# Patient Record
Sex: Male | Born: 1940 | Race: Black or African American | Hispanic: No | Marital: Single | Smoking: Current every day smoker
Health system: Southern US, Community
[De-identification: ages and names within clinical notes are randomized; demographics above are authoritative.]

## PROBLEM LIST (undated history)

## (undated) DIAGNOSIS — I10 Essential (primary) hypertension: Secondary | ICD-10-CM

## (undated) DIAGNOSIS — N184 Chronic kidney disease, stage 4 (severe): Secondary | ICD-10-CM

## (undated) DIAGNOSIS — I5042 Chronic combined systolic (congestive) and diastolic (congestive) heart failure: Secondary | ICD-10-CM

## (undated) DIAGNOSIS — E785 Hyperlipidemia, unspecified: Secondary | ICD-10-CM

## (undated) DIAGNOSIS — I420 Dilated cardiomyopathy: Secondary | ICD-10-CM

## (undated) HISTORY — DX: Hyperlipidemia, unspecified: E78.5

## (undated) HISTORY — DX: Chronic kidney disease, stage 4 (severe): N18.4

## (undated) HISTORY — DX: Essential (primary) hypertension: I10

## (undated) HISTORY — DX: Chronic combined systolic (congestive) and diastolic (congestive) heart failure: I50.42

## (undated) HISTORY — DX: Dilated cardiomyopathy: I42.0

---

## 1998-06-27 ENCOUNTER — Encounter: Payer: Self-pay | Admitting: Internal Medicine

## 2006-06-12 HISTORY — PX: CARDIAC CATHETERIZATION: SHX172

## 2006-06-12 HISTORY — PX: TRANSTHORACIC ECHOCARDIOGRAM: SHX275

## 2006-06-20 ENCOUNTER — Inpatient Hospital Stay (HOSPITAL_COMMUNITY): Admission: EM | Admit: 2006-06-20 | Discharge: 2006-06-27 | Payer: Self-pay | Admitting: Emergency Medicine

## 2006-08-27 ENCOUNTER — Inpatient Hospital Stay: Payer: Self-pay | Admitting: Internal Medicine

## 2006-08-27 ENCOUNTER — Other Ambulatory Visit: Payer: Self-pay

## 2006-08-28 ENCOUNTER — Encounter: Payer: Self-pay | Admitting: Internal Medicine

## 2006-08-31 ENCOUNTER — Other Ambulatory Visit: Payer: Self-pay

## 2006-10-21 ENCOUNTER — Other Ambulatory Visit: Payer: Self-pay

## 2006-10-22 ENCOUNTER — Inpatient Hospital Stay: Payer: Self-pay | Admitting: *Deleted

## 2006-10-23 ENCOUNTER — Other Ambulatory Visit: Payer: Self-pay

## 2006-11-18 ENCOUNTER — Inpatient Hospital Stay: Payer: Self-pay | Admitting: Internal Medicine

## 2006-11-18 ENCOUNTER — Other Ambulatory Visit: Payer: Self-pay

## 2006-12-14 ENCOUNTER — Inpatient Hospital Stay: Payer: Self-pay | Admitting: Internal Medicine

## 2008-05-11 ENCOUNTER — Encounter: Payer: Self-pay | Admitting: Internal Medicine

## 2008-11-17 ENCOUNTER — Encounter: Payer: Self-pay | Admitting: Internal Medicine

## 2008-12-11 ENCOUNTER — Encounter: Payer: Self-pay | Admitting: Internal Medicine

## 2009-06-11 ENCOUNTER — Ambulatory Visit: Payer: Self-pay | Admitting: Cardiovascular Disease

## 2009-06-11 DIAGNOSIS — I509 Heart failure, unspecified: Secondary | ICD-10-CM | POA: Insufficient documentation

## 2009-06-11 DIAGNOSIS — R011 Cardiac murmur, unspecified: Secondary | ICD-10-CM

## 2009-06-11 LAB — CONVERTED CEMR LAB: Pro B Natriuretic peptide (BNP): 754 pg/mL — ABNORMAL HIGH (ref 0.0–100.0)

## 2009-06-15 LAB — CONVERTED CEMR LAB
BUN: 60 mg/dL — ABNORMAL HIGH (ref 6–23)
Chloride: 100 meq/L (ref 96–112)
Glucose, Bld: 86 mg/dL (ref 70–99)
Potassium: 4.3 meq/L (ref 3.5–5.3)
Sodium: 136 meq/L (ref 135–145)

## 2009-09-06 ENCOUNTER — Inpatient Hospital Stay: Payer: Self-pay | Admitting: Internal Medicine

## 2010-04-12 NOTE — Assessment & Plan Note (Signed)
Summary: NP6/AMD   Visit Type:  New Patient Referring Provider:  Mariah Milling Primary Provider:  Daisy Lazar, MD  CC:  some short of breath.  History of Present Illness: Mr. Isaac Collins is a very pleasant 70 year old gentleman, known to me from [facility], with a history of nonischemic cardiomyopathy, history of severe congestive heart failure who was placed on hospice in 2008 who is been doing very well since that time on aggressive medical management and diuresis who presents to reestablish care.  He states that he has had some recent abdominal swelling. His sister increased his diuretic to twice a day. She states that he has not been taking it more than once a day with metolazone in the morning and this could have caused the fluid accumulation. With Lasix b.i.d., his abdominal swelling has significantly improved and is almost at baseline.  His last echocardiogram showed an ejection fraction 25%, dilated left ventricle, moderate mitral regurg, moderate tricuspid regurg, mildly elevated right ventricular systolic pressure estimated at 36 mmHg, severe right ventricular systolic dysfunction. Date of this echocardiogram is uncertain. This was done by Dr. Bethann Punches of the internal medicine Department at [facility].     Preventive Screening-Counseling & Management  Alcohol-Tobacco     Alcohol drinks/day: <1     Smoking Status: current     Packs/Day: 1-2 cigs a day  Caffeine-Diet-Exercise     Caffeine use/day: 1 cup     Does Patient Exercise: some  Current Problems (verified): 1)  Cardiac Murmur  (ICD-785.2) 2)  CHF  (ICD-428.0)  Current Medications (verified): 1)  Isosorbide Mononitrate Cr 30 Mg Xr24h-Tab (Isosorbide Mononitrate) .... Take One Tablet By Mouth Daily 2)  Spironolactone 25 Mg Tabs (Spironolactone) .... Take One Tablet By Mouth Daily 3)  Metolazone 5 Mg Tabs (Metolazone) .... Take One Tablet By Mouth Two Times A Day 4)  Ibuprofen 400 Mg Tabs (Ibuprofen) ....  As Needed 5)  Allopurinol 100 Mg Tabs (Allopurinol) .Marland Kitchen.. 1 By Mouth Once Daily 6)  Hydralazine Hcl 10 Mg Tabs (Hydralazine Hcl) .... 1/2 By Mouth Two Times A Day 7)  Hydroxyzine Hcl 25 Mg Tabs (Hydroxyzine Hcl) .Marland Kitchen.. 1 By Mouth Four Times A Day As Needed For Itching. 8)  Digoxin 0.125 Mg Tabs (Digoxin) .... Take One Tablet By Mouth Daily 9)  Furosemide 80 Mg Tabs (Furosemide) .... Take One Tablet By Mouth Every 8 Hours 10)  Tylenol Arthritis Pain 650 Mg Cr-Tabs (Acetaminophen) .... As Needed 11)  Carvedilol 6.25 Mg Tabs (Carvedilol) .... Take One Tablet By Mouth Twice A Day 12)  Enalapril Maleate 5 Mg Tabs (Enalapril Maleate) .... Take One Tablet By Mouth Twice A Day  Allergies (verified): No Known Drug Allergies  Past History:  Past Medical History: Last updated: 01/18/2009 Nonischemic cardiomyopathy Severe congestive heart failure Hypertension Hyperlipidemia  Risk Factors: Alcohol Use: <1 (06/11/2009) Caffeine Use: 1 cup (06/11/2009) Exercise: some (06/11/2009)  Risk Factors: Smoking Status: current (06/11/2009) Packs/Day: 1-2 cigs a day (06/11/2009)  Social History: Alcohol drinks/day:  <1 Smoking Status:  current Packs/Day:  1-2 cigs a day Caffeine use/day:  1 cup Does Patient Exercise:  some  Review of Systems  The patient denies fever, weight loss, weight gain, vision loss, decreased hearing, hoarseness, chest pain, syncope, dyspnea on exertion, peripheral edema, prolonged cough, abdominal pain, incontinence, muscle weakness, depression, and enlarged lymph nodes.         ABD swelling, improving  Vital Signs:  Patient profile:   70 year old male Height:  69 inches Weight:      133.50 pounds BMI:     19.79 Pulse rate:   111 / minute Pulse rhythm:   regular BP sitting:   100 / 80  (left arm) Cuff size:   regular  Vitals Entered By: Mercer Pod (June 11, 2009 11:48 AM)  Physical Exam  General:  well-appearing, thin male in no apparent distress,  alert and oriented x3, HEENT exam is benign, oropharynx is clear, neck is supple with no JVP or carotid bruits, heart sounds are tachycardic with S1-S2 and no murmurs appreciated, lungs are clear to auscultation with no wheezes or rales, abdominal exam is benign, no significant lower extremity edema, neurologic exam is grossly nonfocal, skin is warm and dry.   New Orders:     1)  T-Basic Metabolic Panel [id number])     2)  T-BNP  (B Natriuretic Peptide) ([id number])     3)  T-Digoxin ([id number])   EKG  Procedure date:  06/11/2009  Findings:      sinus tachycardia with poor R-wave progression through the precordial leads, LVH, left anterior fascicular block, T-wave abnormality in leads V6, borderline T waves in lead one and aVL.  Impression & Recommendations:  Problem # 1:  CHF (ICD-428.0) history of nonischemic cardiomyopathy, systolic heart failure. He has been out of the hospital for over 2 years on aggressive medical management.  His last echocardiogram suggested an ejection fraction of less than 25%. He has not had any runs of palpitations or tachyarrhythmias concerning for VT though he certainly would be at risk of ventricular arrhythmia. We will talk with him and see whether he would like to meet with electrophysiology. We did make his request previously and he canceled his visit.  His heart rate is elevated today. We will have him complete some routine blood work to make her that he is not overly diuresed. We will also increase his Coreg to 12.5 mg b.i.d.. We have continued his other medications at their current doses. There may be some medication noncompliance though in general he appears to be taking most of his medications as directed. We'll call his sister at phone number [phone] with any lab abnormalities or any medications dosage changes.  His updated medication list for this problem includes:    Isosorbide Mononitrate Cr 30 Mg Xr24h-tab (Isosorbide mononitrate) .Marland Kitchen...  Take one tablet by mouth daily    Spironolactone 25 Mg Tabs (Spironolactone) .Marland Kitchen... Take one tablet by mouth daily    Metolazone 5 Mg Tabs (Metolazone) .Marland Kitchen... Take one tablet by mouth two times a day    Digoxin 0.125 Mg Tabs (Digoxin) .Marland Kitchen... Take one tablet by mouth daily    Furosemide 80 Mg Tabs (Furosemide) .Marland Kitchen... Take one tablet by mouth every 8 hours    Carvedilol 12.5 Mg Tabs (Carvedilol) .Marland Kitchen... Take one tablet by mouth twice a day    Enalapril Maleate 5 Mg Tabs (Enalapril maleate) .Marland Kitchen... Take one tablet by mouth twice a day  Orders: T-Basic Metabolic Panel [phone]) T-BNP  (B Natriuretic Peptide) [id number]) T-Digoxin [id number])  Patient Instructions: 1)  Your physician recommends that you schedule a follow-up appointment in: 6 months 2)  Your physician has recommended you make the following change in your medication: increase coreg 12.5mg  two times a day Prescriptions: CARVEDILOL 12.5 MG TABS (CARVEDILOL) Take one tablet by mouth twice a day  #60 x 6   Entered by:   Charlena Cross, RN, BSN   Authorized by:   Dossie Arbour MD  Signed by:   Charlena Cross, RN, BSN on 06/11/2009   Method used:   Electronically to        [facility] [employer] [phone]* (retail)       [address] [place], [place]  [address]       Ph: [phone] or [phone]       Fax: [phone]   RxID:   [id number]

## 2010-04-12 NOTE — Progress Notes (Signed)
Summary: PHI  PHI   Imported By: Harlon Flor 06/14/2009 13:36:14  _____________________________________________________________________  External Attachment:    Type:   Image     Comment:   External Document

## 2010-07-29 NOTE — Discharge Summary (Signed)
[name], Isaac Collins               ACCOUNT NO.:  [id number]   MEDICAL RECORD NO.:  [id number]          PATIENT TYPE:  INP   LOCATION:  2920                         FACILITY:  [facility]   PHYSICIAN:  Nanetta Batty, M.D.   DATE OF BIRTH:  08/14/1940   DATE OF ADMISSION:  06/20/2006  DATE OF DISCHARGE:  06/27/2006                               DISCHARGE SUMMARY   ADDENDUM:  From job #[id number].   DISCHARGE MEDICATIONS:  1. Aspirin 81 mg daily.  2. Coreg 3.125 mg twice a day.  3. Digoxin 0.125 mg a day.  4. K-Dur 20 mEq a day.  5. Lasix 40 mg a day.  6. Bidil 37.5/20 a half three times per day.   He should stop his other medications he was previously on.  He should  reduce his sodium to less than 2000 mg per day.  He should reduce his  fluid intake to 1500 mL or less.   He has a followup appointment with Dr. Allyson Sabal on April 29th at 10:15 in  the [place] office.      [name], N.P.      Nanetta Batty, M.D.  Electronically Signed    BB/MEDQ  D:  06/27/2006  T:  06/27/2006  Job:  [id number]   cc:   Dennison Mascot

## 2010-07-29 NOTE — Discharge Summary (Signed)
[name], LEASE NO.:  [id number]   MEDICAL RECORD NO.:  [id number]          PATIENT TYPE:  INP   LOCATION:  2920                         FACILITY:  [facility]   PHYSICIAN:  Nanetta Batty, M.D.   DATE OF BIRTH:  [birth year]   DATE OF ADMISSION:  06/20/2006  DATE OF DISCHARGE:  06/27/2006                               DISCHARGE SUMMARY   HISTORY AND HOSPITAL COURSE:  The patient is a 70 year old African  American male patient of Dr. Allyson Sabal who is seen in [place].  He  apparently was initially seen in March.  He was self-referred for  shortness of breath and swelling in his legs.  An echocardiogram  revealed an ejection fraction of 10% with severe TR.  His medications  were adjusted, and he came in later in March for followup.  He was class  IV congestive heart failure.  Dr. Allyson Sabal talked to him about admission to  the hospital.  He had some personal business to attend, and he agreed  for admission to the hospital the following day.  Thus, he came to the  hospital on June 20, 2006, for diuresis.  He also has renal  insufficiency.  He was put on intravenous dobutamine and given  intravenous Lasix.  Over the next several days, he diuresed.  He also  had nonsustained ventricular tachycardia.  His dobutamine was titrated  down starting on June 23, 2006 to June 24, 2006.  By June 25, 2006,  he was ready for a right and left heart catheter performed by Dr. Allyson Sabal.  His coronary arteries were normal.  An left ventriculogram was not done  to be conservative with the amount of dye he received.  His RA pressure  was 10/10, his RV 43/9, PA 39/24.  Mean was 31, wedge was 19.  His  cardiac output was 3.6 and 2.6, and his indexes were 2 and 1.4.  The  following day, he was seen by Dr. Alanda Amass.  Dr. Alanda Amass thought he  may eventually need an ICB, but medical trial will need to be done  first.  He did have a mildly elevated PSA of 5.29 on admission.  Thus,  he thought he  would need a followup PSA as an outpatient and possible  referral to a urologist.  He was discharged on June 27, 2006, seen by  Dr. Yates Decamp.  His blood pressure at that time was 103/52.  His  temperature was 97.7.   LABS:  On June 26, 2006, his sodium was 133, potassium was 4.3, BUN was  46, creatinine was 1.85.  On June 27, 2006, his BUN was 38, creatinine  was 1.53, his BNP was 3505.  His hemoglobin was 14, hematocrit was 43,  WBC was 5.7, and platelets was 152.  AST was 38, ALT was 39.  Repeat AST  was 23 and ALT was 23.  CK-MB and troponin were negative.  Initial CK-MB  was 2216.  It came down to 1743 and 1610 and then back to 3505.  PSA was  5.29.  TSH was 1.881.   Chest x-ray on  June 24, 2006 showed no significant change in  cardiomegaly and small right pleural effusion.  EKG showed normal sinus  rhythm left anterior descending, Q waves in V2 and V3, and also inferior  leads.  He did have several runs of an supraventricular tachycardia  during his hospitalization.   DISCHARGE DIAGNOSES:  1. Acute systolic congestive heart failure, Class IV.  2. Nonischemic cardiomyopathy.  3. Status post cardiac catheter with normal coronary arteries.  4. Chronic renal insufficiency.  5. Hypotension, difficult to titrate medications.  6. Nonsustained ventricular tachycardia.  7. Elevated uric acid.      Isaac Collins, N.P.      Nanetta Batty, M.D.  Electronically Signed    BB/MEDQ  D:  06/27/2006  T:  06/27/2006  Job:  [id number]   cc:   Dennison Mascot

## 2010-07-29 NOTE — Cardiovascular Report (Signed)
NAMEDESMOND, SZABO NO.:  [id number]   MEDICAL RECORD NO.:  [id number]          PATIENT TYPE:  INP   LOCATION:  2920                         FACILITY:  [facility]   PHYSICIAN:  Nanetta Batty, M.D.   DATE OF BIRTH:  [birth year]   DATE OF PROCEDURE:  DATE OF DISCHARGE:                            CARDIAC CATHETERIZATION   HISTORY OF PRESENT ILLNESS:  Mr. Isaac Collins is  70 year old African American  male whom I saw March 26th with congestive heart failure and volume  overload.  Echo revealed an EF of 10%.  He also has a history of alcohol  use.  He was admitted to the hospital on April 9th for IV dobutamine and  diuresis.  He diuresed at least ten liters.  He presents now for right  and left heart catheter to define his anatomy and physiology.   PROCEDURAL DESCRIPTION:  The patient was brought to the second floor to  the [facility] cardiac cath lab in the postabsorptive state.  Premedicated on p.o. valium.  His right groin was prepped and shaved in  the usual sterile fashion, 1% Xylocaine was used for local anesthesia.  A 6-French sheath was inserted into the right femoral artery using  standard Seldinger technique.  A 7-French sheath was inserted into the  right femoral vein.  A 7-French ballooned-tip femoral  __________ Theone Murdoch catheter was then advanced to the right heart chambers obtaining  sequential pressures.  Fick and __________ cardiac outputs were also  obtained.  A 6-French right and left diagnostic catheters were used for  __________ coronary angiography.  A left ventriculography was not  performed because of the patient's creatinine due to dye considerations.  LVEDP was measured using a pigtail catheter.   HEMODYNAMICS:  1. Aortic systolic pressure 101, diastolic pressure 74, left      ventricular systolic pressure 104, end-diastolic pressure 21.  2. RA pressure:  A-wave 10, V-wave 10, mean 8.  3. RV pressure:  Systolic  __________, diastolic pressure  9.  4. PA pressure:  Systolic 39, diastolic pressure 24, mean 31.  5. Coronary capillary wedge pressure:  A-wave 21, V-wave 20, mean 19.  6. Cardiac output by Fick was 3.6 liters/minute with an index of 2      liters/minute/m2, right thermodilution 2.6 liters/minute with an      index of 1.4 liters/minute/m2.   SEPTAL CORONARY ANGIOGRAPHY:  1. Left main normal.  2. LAD normal.  3. Left circumflex normal.  4. Right coronary artery was dominant and normal.   IMPRESSION:  Mr. Isaac Collins has normal coronary arteries with cardiac index  of 1.4 l/m2 and fairly normal filling pressures after several days of  dobutamine and diuresis.  He will be treated as a nonischemic  cardiomyopathy.   The sheath was removed and pressure was held on the groin to achieve  hemostasis.  Patient left the operating room in stable condition.      Nanetta Batty, M.D.  Electronically Signed     JB/MEDQ  D:  06/25/2006  T:  06/25/2006  Job:  [id number]   cc:   Patrcia Dolly  [facility]  [facility]  [facility]  [place]

## 2010-08-09 ENCOUNTER — Other Ambulatory Visit: Payer: Self-pay | Admitting: Emergency Medicine

## 2010-08-09 MED ORDER — ISOSORBIDE MONONITRATE ER 30 MG PO TB24: 30.0000 mg | ORAL_TABLET | Freq: Every day | ORAL | Status: DC

## 2010-09-23 ENCOUNTER — Other Ambulatory Visit: Payer: Self-pay | Admitting: Cardiovascular Disease

## 2010-10-05 ENCOUNTER — Encounter: Payer: Self-pay | Admitting: Cardiovascular Disease

## 2010-11-22 ENCOUNTER — Telehealth: Payer: Self-pay

## 2010-11-22 MED ORDER — FUROSEMIDE 80 MG PO TABS: 80.0000 mg | ORAL_TABLET | Freq: Every day | ORAL | Status: DC | PRN

## 2010-11-22 NOTE — Telephone Encounter (Signed)
Refill requested for furosemide 80 mg take one tablet daily as needed.

## 2011-01-12 ENCOUNTER — Other Ambulatory Visit: Payer: Self-pay

## 2011-01-12 MED ORDER — HYDRALAZINE HCL 10 MG PO TABS: 5.0000 mg | ORAL_TABLET | Freq: Two times a day (BID) | ORAL | Status: DC

## 2011-01-12 MED ORDER — METOLAZONE 5 MG PO TABS: 5.0000 mg | ORAL_TABLET | Freq: Two times a day (BID) | ORAL | Status: DC

## 2011-03-15 ENCOUNTER — Telehealth: Payer: Self-pay

## 2011-03-15 NOTE — Telephone Encounter (Signed)
LMOM to have patient call regarding enalapril & hydralazine.  The patient needs to make an appointment to see Dr. Mariah Milling before any refill sent.

## 2011-03-16 ENCOUNTER — Telehealth: Payer: Self-pay

## 2011-03-16 MED ORDER — HYDRALAZINE HCL 10 MG PO TABS: 5.0000 mg | ORAL_TABLET | Freq: Two times a day (BID) | ORAL | Status: DC

## 2011-03-16 MED ORDER — ENALAPRIL MALEATE 5 MG PO TABS: 5.0000 mg | ORAL_TABLET | Freq: Two times a day (BID) | ORAL | Status: DC

## 2011-03-16 NOTE — Telephone Encounter (Signed)
Made patient follow up appointment.

## 2011-03-16 NOTE — Telephone Encounter (Signed)
Refill sent for hydralazine 10 mg take 1/2 tablet bid

## 2011-03-31 ENCOUNTER — Ambulatory Visit: Payer: Self-pay | Admitting: Cardiovascular Disease

## 2011-04-11 ENCOUNTER — Other Ambulatory Visit: Payer: Self-pay | Admitting: Cardiovascular Disease

## 2011-04-12 ENCOUNTER — Ambulatory Visit (INDEPENDENT_AMBULATORY_CARE_PROVIDER_SITE_OTHER): Payer: Medicare Other | Admitting: Cardiovascular Disease

## 2011-04-12 ENCOUNTER — Encounter: Payer: Self-pay | Admitting: Cardiovascular Disease

## 2011-04-12 DIAGNOSIS — R000 Tachycardia, unspecified: Secondary | ICD-10-CM

## 2011-04-12 DIAGNOSIS — R011 Cardiac murmur, unspecified: Secondary | ICD-10-CM

## 2011-04-12 DIAGNOSIS — I509 Heart failure, unspecified: Secondary | ICD-10-CM

## 2011-04-12 DIAGNOSIS — I5020 Unspecified systolic (congestive) heart failure: Secondary | ICD-10-CM

## 2011-04-12 MED ORDER — ISOSORBIDE MONONITRATE ER 30 MG PO TB24: 30.0000 mg | ORAL_TABLET | Freq: Every day | ORAL | Status: DC

## 2011-04-12 MED ORDER — METOLAZONE 5 MG PO TABS: 5.0000 mg | ORAL_TABLET | Freq: Two times a day (BID) | ORAL | Status: DC

## 2011-04-12 MED ORDER — DIGOXIN 125 MCG PO TABS: 125.0000 ug | ORAL_TABLET | Freq: Every day | ORAL | Status: DC

## 2011-04-12 MED ORDER — CARVEDILOL 12.5 MG PO TABS: 12.5000 mg | ORAL_TABLET | Freq: Two times a day (BID) | ORAL | Status: DC

## 2011-04-12 MED ORDER — SPIRONOLACTONE 25 MG PO TABS: 25.0000 mg | ORAL_TABLET | Freq: Every day | ORAL | Status: DC

## 2011-04-12 MED ORDER — TORSEMIDE 20 MG PO TABS: 60.0000 mg | ORAL_TABLET | Freq: Two times a day (BID) | ORAL | Status: DC

## 2011-04-12 NOTE — Assessment & Plan Note (Addendum)
History of severe dilated cardiomyopathy, ejection fraction less than 25. He has been well compensated for years though appears to have acute systolic CHF on today's presentation with edema, abdominal swelling, shortness of breath.  Is not responding to Lasix and metolazone. We will change the Lasix to torsemide 60 mg b.i.d., metolazone 5 mg b.i.d. 30 minutes Prior to the torsemide We will evaluate him in the office next week and have asked him to monitor his weight closely.  If we are unable to improve his heart failure, he may require hospitalization for inotropes with Lasix IV.

## 2011-04-12 NOTE — Progress Notes (Signed)
Patient ID: Isaac Collins, male    DOB: 09/28/1940, 71 y.o.   MRN: [id number]  HPI Comments: Mr. Brutus is a very pleasant 71 year old gentleman with a history of nonischemic cardiomyopathy, history of severe congestive heart failure EF <25% who was placed on hospice in 2008 who had been doing very well since that time on aggressive medical management and diuresis who presents for evaluation with weight gain, lower extremity edema, increasing shortness of breath over the past several weeks.  He reports that he has not been doing as well as he normally does. He weight has come on quickly. He denies drinking much or using salt. He has not run out of his medications and has been relatively compliant. The Lasix was increased to 80 mg daily with metolazone and even with the higher dose, he has not had much urination. He feels it most in his mid section but does have significant edema. Previously he was taking only metolazone once a day, now has been taking metolazone 5 mg b.i.d..   EKG today shows sinus tachycardia with rate 115 beats per minute, nonspecific ST abnormality in V5, V6, left axis deviation, poor R-wave progression through the anterior precordial leads       Outpatient Encounter Prescriptions as of 04/12/2011  Medication Sig Dispense Refill  . acetaminophen (TYLENOL) 650 MG CR tablet Take 650 mg by mouth every 8 (eight) hours as needed.        Marland Kitchen allopurinol (ZYLOPRIM) 100 MG tablet Take 100 mg by mouth daily.        . carvedilol (COREG) 12.5 MG tablet Take 1 tablet (12.5 mg total) by mouth 2 (two) times daily with a meal.  60 tablet  6  . digoxin (LANOXIN) 0.125 MG tablet Take 1 tablet (125 mcg total) by mouth daily.  30 tablet  6  . enalapril (VASOTEC) 5 MG tablet Take 1 tablet (5 mg total) by mouth 2 (two) times daily.  60 tablet  6  . hydrALAZINE (APRESOLINE) 10 MG tablet Take 0.5 tablets (5 mg total) by mouth 2 (two) times daily.  30 tablet  6  . hydrOXYzine (ATARAX) 25 MG tablet Take  25 mg by mouth 4 (four) times daily as needed.        Marland Kitchen ibuprofen (ADVIL,MOTRIN) 400 MG tablet Take 400 mg by mouth every 6 (six) hours as needed.        . isosorbide mononitrate (IMDUR) 30 MG 24 hr tablet Take 1 tablet (30 mg total) by mouth daily.  90 tablet  4  . metolazone (ZAROXOLYN) 5 MG tablet Take 1 tablet (5 mg total) by mouth 2 (two) times daily.  60 tablet  6  . spironolactone (ALDACTONE) 25 MG tablet Take 1 tablet (25 mg total) by mouth daily.  30 tablet  6  .  furosemide (LASIX) 80 MG tablet Take 80 mg by mouth daily.       Review of Systems  Constitutional: Positive for unexpected weight change.  HENT: Negative.   Eyes: Negative.   Respiratory: Positive for shortness of breath.   Cardiovascular: Positive for leg swelling.  Gastrointestinal: Negative.   Musculoskeletal: Negative.   Skin: Negative.   Neurological: Negative.   Hematological: Negative.   Psychiatric/Behavioral: Negative.   All other systems reviewed and are negative.    BP 118/93  Pulse 114  Ht 5\' 9"  (1.753 m)  Wt 145 lb (65.772 kg)  BMI 21.41 kg/m2  Physical Exam  Nursing note and vitals reviewed. Constitutional: He  is oriented to person, place, and time. He appears well-developed and well-nourished.  HENT:  Head: Normocephalic.  Nose: Nose normal.  Mouth/Throat: Oropharynx is clear and moist.  Eyes: Conjunctivae are normal. Pupils are equal, round, and reactive to light.  Neck: Normal range of motion. Neck supple. No JVD present.  Cardiovascular: Normal rate, regular rhythm, S1 normal, S2 normal and intact distal pulses.  Exam reveals gallop and S4. Exam reveals no friction rub.   Murmur heard.  Crescendo systolic murmur is present with a grade of 2/6  Pulmonary/Chest: Effort normal and breath sounds normal. No respiratory distress. He has no wheezes. He has no rales. He exhibits no tenderness.  Abdominal: Soft. Bowel sounds are normal. He exhibits no distension. There is no tenderness.    Musculoskeletal: Normal range of motion. He exhibits no edema and no tenderness.  Lymphadenopathy:    He has no cervical adenopathy.  Neurological: He is alert and oriented to person, place, and time. Coordination normal.  Skin: Skin is warm and dry. No rash noted. No erythema.  Psychiatric: He has a normal mood and affect. His behavior is normal. Judgment and thought content normal.           Assessment and Plan

## 2011-04-12 NOTE — Patient Instructions (Addendum)
Please stop lasix Start torsemide 60 mg twice a day (take three in the am an PM) Take metolazone 5 mg,   30 minutes prior to the torsemide in the Am and PM Decrease your fluid and salt intake  Monitor your weight daily. Call the office of your weight does not start to decrease.   Please call us if you have new issues that need to be addressed before your next appt.  Your physician wants you to follow-up in: 1 week  You will receive a reminder letter in the mail two months in advance. If you don't receive a letter, please call our office to schedule the follow-up appointment.

## 2011-04-12 NOTE — Assessment & Plan Note (Signed)
Tachycardia on today's visit is likely secondary to heart failure. We will not increase his beta blocker at this time and continue diuresis.

## 2011-04-21 ENCOUNTER — Encounter: Payer: Self-pay | Admitting: Cardiovascular Disease

## 2011-04-21 ENCOUNTER — Ambulatory Visit (INDEPENDENT_AMBULATORY_CARE_PROVIDER_SITE_OTHER): Payer: Medicare Other | Admitting: Cardiovascular Disease

## 2011-04-21 DIAGNOSIS — I509 Heart failure, unspecified: Secondary | ICD-10-CM

## 2011-04-21 DIAGNOSIS — R000 Tachycardia, unspecified: Secondary | ICD-10-CM

## 2011-04-21 DIAGNOSIS — I5020 Unspecified systolic (congestive) heart failure: Secondary | ICD-10-CM

## 2011-04-21 NOTE — Assessment & Plan Note (Signed)
Tachycardia has not significantly improved. We'll continue diuresis. This is likely secondary to underlying heart failure.

## 2011-04-21 NOTE — Patient Instructions (Signed)
Please continue your current medications until Tuesday Starting Wednesday, please decrease the metolazone in 1/2 twice a day Continue on torsemide 60 mg twice a day  If you weight starts to go up or you have swelling/edema, go back on metolazone a full pill twice a day  Please call us if you have new issues that need to be addressed before your next appt.  Your physician wants you to follow-up in:  3 weeks  Y

## 2011-04-21 NOTE — Progress Notes (Signed)
Patient ID: Katie Moch, male    DOB: 1940-06-02, 71 y.o.   MRN: [id number]  HPI Comments: Mr. 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  We started him On torsemide 60 mg b.i.d. With metolazone 5 mg b.i.d.Marland Kitchen Last week. Since then he has had at least a 6 pound weight loss, significant improvement in his edema and shortness of breath. He still feels that he is retaining fluid that was significantly better. He is active again feels more like himself.   He denies drinking much or using salt. .   EKG today shows sinus tachycardia with rate 113 beats per minute, nonspecific ST abnormality in V5, V6, left axis deviation, poor R-wave progression through the anterior precordial leads      Outpatient Encounter Prescriptions as of 04/21/2011  Medication Sig Dispense Refill  . acetaminophen (TYLENOL) 650 MG CR tablet Take 650 mg by mouth every 8 (eight) hours as needed.        Marland Kitchen allopurinol (ZYLOPRIM) 100 MG tablet Take 100 mg by mouth daily.        . carvedilol (COREG) 12.5 MG tablet Take 1 tablet (12.5 mg total) by mouth 2 (two) times daily with a meal.  60 tablet  6  . digoxin (LANOXIN) 0.125 MG tablet Take 1 tablet (125 mcg total) by mouth daily.  30 tablet  6  . enalapril (VASOTEC) 5 MG tablet Take 1 tablet (5 mg total) by mouth 2 (two) times daily.  60 tablet  6  . hydrALAZINE (APRESOLINE) 10 MG tablet Take 0.5 tablets (5 mg total) by mouth 2 (two) times daily.  30 tablet  6  . hydrOXYzine (ATARAX) 25 MG tablet Take 25 mg by mouth 4 (four) times daily as needed.        Marland Kitchen ibuprofen (ADVIL,MOTRIN) 400 MG tablet Take 400 mg  by mouth every 6 (six) hours as needed.        . isosorbide mononitrate (IMDUR) 30 MG 24 hr tablet Take 1 tablet (30 mg total) by mouth daily.  90 tablet  4  . metolazone (ZAROXOLYN) 5 MG tablet Take 1 tablet (5 mg total) by mouth 2 (two) times daily.  60 tablet  6  . spironolactone (ALDACTONE) 25 MG tablet Take 1 tablet (25 mg total) by mouth daily.  30 tablet  6  . torsemide (DEMADEX) 20 MG tablet Take 3 tablets (60 mg total) by mouth 2 (two) times daily.  180 tablet  6    Review of Systems  HENT: Negative.   Eyes: Negative.   Respiratory: Positive for shortness of breath.   Gastrointestinal: Negative.   Musculoskeletal: Negative.   Skin: Negative.   Neurological: Negative.   Hematological: Negative.   Psychiatric/Behavioral: Negative.   All other systems reviewed and are negative.    BP 110/78  Pulse 113  Ht 5\' 9"  (1.753 m)  Wt 139 lb (63.05 kg)  BMI 20.53 kg/m2  Physical Exam  Nursing note and vitals reviewed. Constitutional: He is oriented to person, place, and time. He appears well-developed and well-nourished.  HENT:  Head: Normocephalic.  Nose: Nose normal.  Mouth/Throat: Oropharynx is clear and  moist.  Eyes: Conjunctivae are normal. Pupils are equal, round, and reactive to light.  Neck: Normal range of motion. Neck supple. No JVD present.  Cardiovascular: Normal rate, regular rhythm, S1 normal, S2 normal and intact distal pulses.  Exam reveals gallop and S4. Exam reveals no friction rub.   Murmur heard.  Crescendo systolic murmur is present with a grade of 2/6  Pulmonary/Chest: Effort normal and breath sounds normal. No respiratory distress. He has no wheezes. He has no rales. He exhibits no tenderness.  Abdominal: Soft. Bowel sounds are normal. He exhibits no distension. There is no tenderness.  Musculoskeletal: Normal range of motion. He exhibits no edema and no tenderness.  Lymphadenopathy:    He has no cervical adenopathy.  Neurological: He is alert and  oriented to person, place, and time. Coordination normal.  Skin: Skin is warm and dry. No rash noted. No erythema.  Psychiatric: He has a normal mood and affect. His behavior is normal. Judgment and thought content normal.           Assessment and Plan

## 2011-04-21 NOTE — Assessment & Plan Note (Signed)
Systolic CHF symptoms are improving with recent change to his diuretics. He has had a good weight loss over the past week. We have suggested we continue his current regimen for 5 more days and then decrease the metolazone to one half dose b.i.d.

## 2011-05-11 ENCOUNTER — Encounter: Payer: Self-pay | Admitting: Cardiovascular Disease

## 2011-05-11 ENCOUNTER — Ambulatory Visit (INDEPENDENT_AMBULATORY_CARE_PROVIDER_SITE_OTHER): Payer: Medicare Other | Admitting: Cardiovascular Disease

## 2011-05-11 DIAGNOSIS — I5020 Unspecified systolic (congestive) heart failure: Secondary | ICD-10-CM

## 2011-05-11 DIAGNOSIS — R079 Chest pain, unspecified: Secondary | ICD-10-CM

## 2011-05-11 DIAGNOSIS — R000 Tachycardia, unspecified: Secondary | ICD-10-CM

## 2011-05-11 NOTE — Patient Instructions (Addendum)
You are doing well. No medication changes were made.  Please continue  metolazone  1/2 twice a day  Continue on torsemide 60 mg twice a day  If you weight starts to go up or you have swelling/edema, go back on metolazone a full pill twice a day GOAL weight is 135 to 137 lb  Please call us if you have new issues that need to be addressed before your next appt.  Your physician wants you to follow-up in: 3 months.  You will receive a reminder letter in the mail two months in advance. If you don't receive a letter, please call our office to schedule the follow-up appointment.

## 2011-05-11 NOTE — Assessment & Plan Note (Signed)
Heart rate has improved as his heart failure has resolved. Currently in the low 90 range.

## 2011-05-11 NOTE — Progress Notes (Signed)
Patient ID: Isaac Collins, male    DOB: 1940-04-01, 71 y.o.   MRN: [id number]  HPI Comments: Isaac Collins is a very pleasant 71 year old gentleman with a history of nonischemic cardiomyopathy, history of severe congestive heart failure EF <25% who was placed on hospice in 2008 who had been doing very well since that time on aggressive medical management and diuresis who presented At the end of January 2013 for evaluation with weight gain, lower extremity edema, increasing shortness of breath over the past several weeks.  We started him On torsemide 60 mg b.i.d. With metolazone 5 mg b.i.d..  After significant weight loss, his metolazone was decreased to 2.5 mg twice a day. Since his last clinic visit 20 days ago, he has had several pound weight loss and feels well with no significant shortness of breath with exertion. He feels that he is at his baseline. He does report having an episode of significant chest pain several days ago lasting 30 minutes that resolved without intervention. He has been fine since then and has been active with no complaints.   EKG today shows sinus tachycardia with rate 92 beats per minute, nonspecific ST abnormality in V5, V6, left axis deviation, poor R-wave progression through the anterior precordial leads      Outpatient Encounter Prescriptions as of 05/11/2011  Medication Sig Dispense Refill  . acetaminophen (TYLENOL) 650 MG CR tablet Take 650 mg by mouth every 8 (eight) hours as needed.        Marland Kitchen allopurinol (ZYLOPRIM) 100 MG tablet Take 100 mg by mouth daily.        . carvedilol (COREG) 12.5 MG tablet Take 1 tablet (12.5 mg total) by mouth 2 (two) times daily with a meal.  60 tablet  6  . digoxin (LANOXIN) 0.125 MG tablet Take 1 tablet (125 mcg total) by mouth daily.  30 tablet  6  . enalapril (VASOTEC) 5 MG tablet Take 1 tablet (5 mg total) by mouth 2 (two) times daily.  60 tablet  6  . hydrALAZINE (APRESOLINE) 10 MG tablet Take 0.5 tablets (5 mg total) by mouth 2  (two) times daily.  30 tablet  6  . hydrOXYzine (ATARAX) 25 MG tablet Take 25 mg by mouth 4 (four) times daily as needed.        Marland Kitchen ibuprofen (ADVIL,MOTRIN) 400 MG tablet Take 400 mg by mouth every 6 (six) hours as needed.        . isosorbide mononitrate (IMDUR) 30 MG 24 hr tablet Take 1 tablet (30 mg total) by mouth daily.  90 tablet  4  . metolazone (ZAROXOLYN) 5 MG tablet Take 2.5 mg by mouth 2 (two) times daily.      Marland Kitchen spironolactone (ALDACTONE) 25 MG tablet Take 1 tablet (25 mg total) by mouth daily.  30 tablet  6  . torsemide (DEMADEX) 20 MG tablet Take 3 tablets (60 mg total) by mouth 2 (two) times daily.  180 tablet  6    Review of Systems  HENT: Negative.   Eyes: Negative.   Gastrointestinal: Negative.   Musculoskeletal: Negative.   Skin: Negative.   Neurological: Negative.   Hematological: Negative.   Psychiatric/Behavioral: Negative.   All other systems reviewed and are negative.    BP 82/60  Pulse 92  Ht 5\' 9"  (1.753 m)  Wt 136 lb (61.689 kg)  BMI 20.08 kg/m2  Physical Exam  Nursing note and vitals reviewed. Constitutional: He is oriented to person, place, and time. He appears well-developed  and well-nourished.  HENT:  Head: Normocephalic.  Nose: Nose normal.  Mouth/Throat: Oropharynx is clear and moist.  Eyes: Conjunctivae are normal. Pupils are equal, round, and reactive to light.  Neck: Normal range of motion. Neck supple. No JVD present.  Cardiovascular: Normal rate, regular rhythm, S1 normal, S2 normal and intact distal pulses.  Exam reveals gallop and S4. Exam reveals no friction rub.   Murmur heard.  Crescendo systolic murmur is present with a grade of 2/6  Pulmonary/Chest: Effort normal and breath sounds normal. No respiratory distress. He has no wheezes. He has no rales. He exhibits no tenderness.  Abdominal: Soft. Bowel sounds are normal. He exhibits no distension. There is no tenderness.  Musculoskeletal: Normal range of motion. He exhibits no edema and  no tenderness.  Lymphadenopathy:    He has no cervical adenopathy.  Neurological: He is alert and oriented to person, place, and time. Coordination normal.  Skin: Skin is warm and dry. No rash noted. No erythema.  Psychiatric: He has a normal mood and affect. His behavior is normal. Judgment and thought content normal.           Assessment and Plan

## 2011-05-11 NOTE — Assessment & Plan Note (Signed)
Improved symptoms over the past month or so. Back at his baseline and he feels well. I'm hesitant to decrease his diuretic more given his recent CHF exacerbation. We'll continue him on his current medication regimen for now.

## 2011-08-14 ENCOUNTER — Encounter: Payer: Self-pay | Admitting: Cardiovascular Disease

## 2011-08-14 ENCOUNTER — Ambulatory Visit (INDEPENDENT_AMBULATORY_CARE_PROVIDER_SITE_OTHER): Payer: Medicare Other | Admitting: Cardiovascular Disease

## 2011-08-14 VITALS — BP 110/80 | HR 101 | Ht 69.0 in | Wt 133.2 lb

## 2011-08-14 DIAGNOSIS — I5020 Unspecified systolic (congestive) heart failure: Secondary | ICD-10-CM

## 2011-08-14 DIAGNOSIS — I509 Heart failure, unspecified: Secondary | ICD-10-CM

## 2011-08-14 MED ORDER — SILDENAFIL CITRATE 100 MG PO TABS: 100.0000 mg | ORAL_TABLET | Freq: Every day | ORAL | Status: DC | PRN

## 2011-08-14 NOTE — Progress Notes (Signed)
Patient ID: Isaac Collins, male    DOB: 01-14-1941, 71 y.o.   MRN: [id number]  HPI Comments: [name] is a very pleasant 71 year old gentleman with a history of nonischemic cardiomyopathy, history of severe congestive heart failure EF <25% who was placed on hospice in 2008 who had been doing very well since that time on aggressive medical management and diuresis who presented At the end of January 2013 for evaluation with weight gain, lower extremity edema, increasing shortness of breath over the past several weeks. He presents today for routine followup  He has been doing well on torsemide 60 mg b.i.d. With metolazone 2.5 mg b.i.d..  His weight has been relatively stable with no significant shortness of breath or edema. He does report having erectile dysfunction and would like to start Viagra. He has used this before with good success   EKG today shows sinus tachycardia with rate 101 beats per minute, nonspecific ST abnormality in V5, V6, left axis deviation, poor R-wave progression through the anterior precordial leads, APCs      Outpatient Encounter Prescriptions as of 08/14/2011  Medication Sig Dispense Refill  . allopurinol (ZYLOPRIM) 100 MG tablet Take 100 mg by mouth daily.        . carvedilol (COREG) 12.5 MG tablet Take 1 tablet (12.5 mg total) by mouth 2 (two) times daily with a meal.  60 tablet  6  . digoxin (LANOXIN) 0.125 MG tablet Take 1 tablet (125 mcg total) by mouth daily.  30 tablet  6  . enalapril (VASOTEC) 5 MG tablet Take 1 tablet (5 mg total) by mouth 2 (two) times daily.  60 tablet  6  . hydrALAZINE (APRESOLINE) 10 MG tablet Take 0.5 tablets (5 mg total) by mouth 2 (two) times daily.  30 tablet  6  . ibuprofen (ADVIL,MOTRIN) 400 MG tablet Take 400 mg by mouth every 6 (six) hours as needed.        . isosorbide mononitrate (IMDUR) 30 MG 24 hr tablet Take 1 tablet (30 mg total) by mouth daily.  90 tablet  4  . metolazone (ZAROXOLYN) 5 MG tablet Take 2.5 mg by mouth 2 (two)  times daily.      Marland Kitchen spironolactone (ALDACTONE) 25 MG tablet Take 1 tablet (25 mg total) by mouth daily.  30 tablet  6  . torsemide (DEMADEX) 20 MG tablet Take 3 tablets (60 mg total) by mouth 2 (two) times daily.  180 tablet  6  . sildenafil (VIAGRA) 100 MG tablet Take 1 tablet (100 mg total) by mouth daily as needed for erectile dysfunction.  2 tablet  11    Review of Systems  Constitutional: Negative.   HENT: Negative.   Eyes: Negative.   Respiratory: Negative.   Cardiovascular: Negative.   Gastrointestinal: Negative.   Musculoskeletal: Negative.   Skin: Negative.   Neurological: Negative.   Hematological: Negative.   Psychiatric/Behavioral: Negative.   All other systems reviewed and are negative.    BP 110/80  Pulse 101  Ht 5\' 9"  (1.753 m)  Wt 133 lb 4 oz (60.442 kg)  BMI 19.68 kg/m2  Physical Exam  Nursing note and vitals reviewed. Constitutional: He is oriented to person, place, and time. He appears well-developed and well-nourished.  HENT:  Head: Normocephalic.  Nose: Nose normal.  Mouth/Throat: Oropharynx is clear and moist.  Eyes: Conjunctivae are normal. Pupils are equal, round, and reactive to light.  Neck: Normal range of motion. Neck supple. No JVD present.  Cardiovascular: Normal rate, regular  rhythm, S1 normal, S2 normal and intact distal pulses.  Exam reveals gallop and S4. Exam reveals no friction rub.   Murmur heard.  Crescendo systolic murmur is present with a grade of 2/6  Pulmonary/Chest: Effort normal and breath sounds normal. No respiratory distress. He has no wheezes. He has no rales. He exhibits no tenderness.  Abdominal: Soft. Bowel sounds are normal. He exhibits no distension. There is no tenderness.  Musculoskeletal: Normal range of motion. He exhibits no edema and no tenderness.  Lymphadenopathy:    He has no cervical adenopathy.  Neurological: He is alert and oriented to person, place, and time. Coordination normal.  Skin: Skin is warm and  dry. No rash noted. No erythema.  Psychiatric: He has a normal mood and affect. His behavior is normal. Judgment and thought content normal.           Assessment and Plan

## 2011-08-14 NOTE — Assessment & Plan Note (Signed)
Relatively stable weight with no significant signs of heart failure. We'll continue his current medication regimen

## 2011-08-14 NOTE — Patient Instructions (Signed)
You are doing well. Try viagra as needed.  When you take the viagra, hold the isosorbide on that day.  You can try a half a viagra to see if that works  Please call us if you have new issues that need to be addressed before your next appt.  Your physician wants you to follow-up in: 6 months.  You will receive a reminder letter in the mail two months in advance. If you don't receive a letter, please call our office to schedule the follow-up appointment.

## 2011-09-12 ENCOUNTER — Other Ambulatory Visit: Payer: Self-pay | Admitting: Cardiovascular Disease

## 2011-09-12 MED ORDER — SILDENAFIL CITRATE 100 MG PO TABS: 100.0000 mg | ORAL_TABLET | Freq: Every day | ORAL | Status: DC | PRN

## 2011-09-12 NOTE — Telephone Encounter (Signed)
Pt was unable to pick it up last month and wants it called in again

## 2011-09-12 NOTE — Telephone Encounter (Signed)
Refilled sildenafil

## 2011-11-08 ENCOUNTER — Other Ambulatory Visit: Payer: Self-pay | Admitting: Cardiovascular Disease

## 2011-11-08 NOTE — Telephone Encounter (Signed)
Refilled Carvedilol. 

## 2012-02-05 ENCOUNTER — Ambulatory Visit (INDEPENDENT_AMBULATORY_CARE_PROVIDER_SITE_OTHER): Payer: Medicare Other | Admitting: Cardiovascular Disease

## 2012-02-05 ENCOUNTER — Encounter: Payer: Self-pay | Admitting: Cardiovascular Disease

## 2012-02-05 VITALS — BP 126/86 | HR 73 | Ht 69.0 in | Wt 135.5 lb

## 2012-02-05 DIAGNOSIS — I509 Heart failure, unspecified: Secondary | ICD-10-CM

## 2012-02-05 DIAGNOSIS — R000 Tachycardia, unspecified: Secondary | ICD-10-CM

## 2012-02-05 DIAGNOSIS — I5020 Unspecified systolic (congestive) heart failure: Secondary | ICD-10-CM

## 2012-02-05 NOTE — Patient Instructions (Addendum)
You are doing well. No medication changes were made.  Please call us if you have new issues that need to be addressed before your next appt.  Your physician wants you to follow-up in: 6 months.  You will receive a reminder letter in the mail two months in advance. If you don't receive a letter, please call our office to schedule the follow-up appointment.   

## 2012-02-05 NOTE — Assessment & Plan Note (Signed)
Heart rate significantly improved suggesting stable, compensated systolic CHF

## 2012-02-05 NOTE — Progress Notes (Signed)
Patient ID: Isaac Collins, male    DOB: 03-25-1940, 71 y.o.   MRN: [id number]  HPI Comments: Isaac Collins is a very pleasant 71 year old gentleman with a history of nonischemic cardiomyopathy, history of severe congestive heart failure EF <25% who was placed on hospice in 2008 who had been doing very well since that time on aggressive medical management and diuresis who presented At the end of January 2013 for evaluation with weight gain, lower extremity edema, increasing shortness of breath over the past several weeks. He presents today for routine followup  He has been doing well on torsemide 60 mg b.i.d. With metolazone 2.5 mg b.i.d..  His medication list today also reports he is taking Lasix  His mouth is dry, no edema, no shortness of breath. Skin is dry. He feels very thirsty  EKG today shows sinus rhythm with rate 73 beats per minute, poor R-wave progression to the anterior precordial leads  Outpatient Encounter Prescriptions as of 02/05/2012  Medication Sig Dispense Refill  . allopurinol (ZYLOPRIM) 100 MG tablet Take 100 mg by mouth daily.        . carvedilol (COREG) 12.5 MG tablet Take 1 tablet (12.5 mg total) by mouth 2 (two) times daily with a meal.  60 tablet  6  . digoxin (LANOXIN) 0.125 MG tablet Take 1 tablet (125 mcg total) by mouth daily.  30 tablet  6  . enalapril (VASOTEC) 5 MG tablet Take 1 tablet (5 mg total) by mouth 2 (two) times daily.  60 tablet  6  . hydrALAZINE (APRESOLINE) 10 MG tablet Take 0.5 tablets (5 mg total) by mouth 2 (two) times daily.  30 tablet  6  . ibuprofen (ADVIL,MOTRIN) 400 MG tablet Take 400 mg by mouth every 6 (six) hours as needed.        . metolazone (ZAROXOLYN) 5 MG tablet Take 2.5 mg by mouth 2 (two) times daily.      . sildenafil (VIAGRA) 100 MG tablet Take 1 tablet (100 mg total) by mouth daily as needed for erectile dysfunction.  2 tablet  5  . spironolactone (ALDACTONE) 25 MG tablet Take 1 tablet (25 mg total) by mouth daily.  30 tablet  6  .  torsemide (DEMADEX) 20 MG tablet Take 3 tablets (60 mg total) by mouth 2 (two) times daily.  180 tablet  6  . isosorbide mononitrate (IMDUR) 30 MG 24 hr tablet Take 1 tablet (30 mg total) by mouth daily.  90 tablet  4  . [DISCONTINUED] carvedilol (COREG) 12.5 MG tablet TAKE ONE TABLET BY MOUTH TWICE A DAY  60 tablet  6    Review of Systems  Constitutional: Negative.        Dry mouth, dry skin  HENT: Negative.   Eyes: Negative.   Respiratory: Negative.   Cardiovascular: Negative.   Gastrointestinal: Negative.   Musculoskeletal: Negative.   Skin: Negative.   Neurological: Negative.   Hematological: Negative.   Psychiatric/Behavioral: Negative.   All other systems reviewed and are negative.    BP 126/86  Pulse 73  Ht 5\' 9"  (1.753 m)  Wt 135 lb 8 oz (61.462 kg)  BMI 20.01 kg/m2  Physical Exam  Nursing note and vitals reviewed. Constitutional: He is oriented to person, place, and time. He appears well-developed and well-nourished.  HENT:  Head: Normocephalic.  Nose: Nose normal.  Mouth/Throat: Oropharynx is clear and moist.  Eyes: Conjunctivae normal are normal. Pupils are equal, round, and reactive to light.  Neck: Normal range of  motion. Neck supple. No JVD present.  Cardiovascular: Normal rate, regular rhythm, S1 normal, S2 normal and intact distal pulses.  Exam reveals no friction rub.   Murmur heard.  Crescendo systolic murmur is present with a grade of 2/6  Pulmonary/Chest: Effort normal and breath sounds normal. No respiratory distress. He has no wheezes. He has no rales. He exhibits no tenderness.  Abdominal: Soft. Bowel sounds are normal. He exhibits no distension. There is no tenderness.  Musculoskeletal: Normal range of motion. He exhibits no edema and no tenderness.  Lymphadenopathy:    He has no cervical adenopathy.  Neurological: He is alert and oriented to person, place, and time. Coordination normal.  Skin: Skin is warm and dry. No rash noted. No erythema.    Psychiatric: He has a normal mood and affect. His behavior is normal. Judgment and thought content normal.           Assessment and Plan

## 2012-02-05 NOTE — Assessment & Plan Note (Signed)
Weight is stable, overall doing well. He has Lasix and torsemide on his medication list. We will hold the Lasix, check a basic metabolic panel today. We will continue torsemide 60 mg twice a day with metolazone 2.5 mg twice a day. He has been stable on this regimen.

## 2012-02-06 LAB — BASIC METABOLIC PANEL
BUN: 111 mg/dL (ref 8–27)
CO2: 18 mmol/L — ABNORMAL LOW (ref 19–28)
Chloride: 102 mmol/L (ref 97–108)
Creatinine, Ser: 3.52 mg/dL — ABNORMAL HIGH (ref 0.76–1.27)
GFR calc Af Amer: 19 mL/min/{1.73_m2} — ABNORMAL LOW (ref >59)
Glucose: 115 mg/dL — ABNORMAL HIGH (ref 65–99)

## 2012-02-15 ENCOUNTER — Other Ambulatory Visit: Payer: Self-pay

## 2012-02-15 DIAGNOSIS — N289 Disorder of kidney and ureter, unspecified: Secondary | ICD-10-CM

## 2012-02-29 ENCOUNTER — Ambulatory Visit (INDEPENDENT_AMBULATORY_CARE_PROVIDER_SITE_OTHER): Payer: Medicare Other

## 2012-02-29 DIAGNOSIS — N289 Disorder of kidney and ureter, unspecified: Secondary | ICD-10-CM

## 2012-03-01 LAB — BASIC METABOLIC PANEL
BUN: 71 mg/dL — ABNORMAL HIGH (ref 8–27)
CO2: 16 mmol/L — ABNORMAL LOW (ref 19–28)
Chloride: 102 mmol/L (ref 97–108)
Creatinine, Ser: 3.32 mg/dL — ABNORMAL HIGH (ref 0.76–1.27)
GFR calc Af Amer: 21 mL/min/{1.73_m2} — ABNORMAL LOW (ref >59)
Glucose: 100 mg/dL — ABNORMAL HIGH (ref 65–99)

## 2012-04-15 ENCOUNTER — Other Ambulatory Visit: Payer: Self-pay

## 2012-04-15 MED ORDER — TORSEMIDE 20 MG PO TABS: ORAL_TABLET | ORAL | Status: DC

## 2012-04-23 ENCOUNTER — Ambulatory Visit (INDEPENDENT_AMBULATORY_CARE_PROVIDER_SITE_OTHER): Payer: Medicaid Other | Admitting: Cardiovascular Disease

## 2012-04-23 ENCOUNTER — Encounter: Payer: Self-pay | Admitting: Cardiovascular Disease

## 2012-04-23 VITALS — BP 102/74 | HR 77 | Ht 69.0 in | Wt 135.2 lb

## 2012-04-23 DIAGNOSIS — I509 Heart failure, unspecified: Secondary | ICD-10-CM

## 2012-04-23 DIAGNOSIS — I5020 Unspecified systolic (congestive) heart failure: Secondary | ICD-10-CM

## 2012-04-23 MED ORDER — TORSEMIDE 20 MG PO TABS: 40.0000 mg | ORAL_TABLET | Freq: Every day | ORAL | Status: DC

## 2012-04-23 NOTE — Assessment & Plan Note (Signed)
He is confused with his diuretics. We have suggested he hold metolazone, hold Lasix, hold digoxin given his renal failure. He would take torsemide 40 mg daily. We have ordered an echocardiogram and will see him at that time. This will help evaluate his right ventricular systolic pressures. No clinical signs of heart failure.

## 2012-04-23 NOTE — Patient Instructions (Addendum)
  Hold the furosemide/lasix and the metolazone and digoxin Continue on torsemide 2 pills in the AM  We will schedule an echocardiogram for heart failure and renal failure in one month  Please call us if you have new issues that need to be addressed before your next appt.  Your physician wants you to follow-up in: 1 month, same day as echocardiogram

## 2012-04-23 NOTE — Progress Notes (Signed)
Patient ID: Isaac Collins, male    DOB: Feb 06, 1941, 72 y.o.   MRN: [id number]  HPI Comments: Mr. 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  He presents today for routine followup  For the past several months, routine lab work identified renal failure with elevated BUN. We have been weaning his diuretics. Repeat blood work shows minimal improvement in his renal function. He presents today and reports that he continues to take torsemide, metolazone, Lasix. Uncertain why he is taking Lasix and metolazone on a regular basis. Family is not with him today and uncertain if they are helping him with his medications. He denies any edema, shortness of breath, PND or orthopnea.  No recent echocardiogram  EKG today shows sinus rhythm with rate 77 beats per minute, poor R-wave progression to the anterior precordial leads  Outpatient Encounter Prescriptions as of 04/23/2012  Medication Sig Dispense Refill  . allopurinol (ZYLOPRIM) 300 MG tablet Take 300 mg by mouth daily.      . carvedilol (COREG) 25 MG tablet Take 25 mg by mouth daily.      . enalapril (VASOTEC) 5 MG tablet Take 1 tablet (5 mg total) by mouth 2 (two) times daily.  60 tablet  6  . hydrALAZINE (APRESOLINE) 10 MG tablet Take 0.5 tablets (5 mg total) by mouth 2 (two) times daily.  30 tablet  6  . ibuprofen (ADVIL,MOTRIN) 400 MG tablet Take 400 mg by mouth every 6 (six) hours as needed.        . sildenafil (VIAGRA) 100 MG tablet Take 1 tablet (100 mg total) by mouth daily as needed for erectile dysfunction.  2 tablet  5  .  spironolactone (ALDACTONE) 25 MG tablet Take 1 tablet (25 mg total) by mouth daily.  30 tablet  6  . torsemide (DEMADEX) 20 MG tablet Take 2 tablets (40 mg total) by mouth daily.  60 tablet  11  . digoxin (LANOXIN) 0.125 MG tablet Take 1 tablet (125 mcg total) by mouth daily.  30 tablet  6  .  furosemide (LASIX) 80 MG tablet Take 80 mg by mouth daily.uncertain if he is taking this      .  metolazone (ZAROXOLYN) 5 MG tablet Take 5 mg by mouth daily.      Marland Kitchen torsemide (DEMADEX) 20 MG tablet Take twice daily but Hold PM dose QOD  180 tablet  6   Review of Systems  Constitutional: Negative.   HENT: Negative.   Eyes: Negative.   Respiratory: Negative.   Cardiovascular: Negative.   Gastrointestinal: Negative.   Musculoskeletal: Negative.   Skin: Negative.   Neurological: Negative.   Psychiatric/Behavioral: Negative.   All other systems reviewed and are negative.    BP 102/74  Pulse 77  Ht 5\' 9"  (1.753 m)  Wt 135 lb 4 oz (61.349 kg)  BMI 19.96 kg/m2  Physical Exam  Nursing note and vitals reviewed. Constitutional: He is oriented to person, place, and time. He appears well-developed and well-nourished.  HENT:  Head: Normocephalic.  Nose: Nose normal.  Mouth/Throat: Oropharynx is clear and moist.  Eyes: Conjunctivae are normal. Pupils are  equal, round, and reactive to light.  Neck: Normal range of motion. Neck supple. No JVD present.  Cardiovascular: Normal rate, regular rhythm, S1 normal, S2 normal and intact distal pulses.  Exam reveals no gallop and no friction rub.   Murmur heard.  Crescendo systolic murmur is present with a grade of 2/6  Pulmonary/Chest: Effort normal and breath sounds normal. No respiratory distress. He has no wheezes. He has no rales. He exhibits no tenderness.  Abdominal: Soft. Bowel sounds are normal. He exhibits no distension. There is no tenderness.  Musculoskeletal: Normal range of motion. He exhibits no edema and no tenderness.  Lymphadenopathy:    He  has no cervical adenopathy.  Neurological: He is alert and oriented to person, place, and time. Coordination normal.  Skin: Skin is warm and dry. No rash noted. No erythema.  Psychiatric: He has a normal mood and affect. His behavior is normal. Judgment and thought content normal.      Assessment and Plan

## 2012-06-11 ENCOUNTER — Other Ambulatory Visit: Payer: Medicare Other

## 2012-06-11 ENCOUNTER — Ambulatory Visit: Payer: Medicare Other | Admitting: Cardiovascular Disease

## 2012-06-12 ENCOUNTER — Other Ambulatory Visit: Payer: Self-pay | Admitting: Cardiovascular Disease

## 2012-06-12 ENCOUNTER — Telehealth: Payer: Self-pay | Admitting: *Deleted

## 2012-06-12 NOTE — Telephone Encounter (Signed)
Spoke with pt and pt mentioned that he was needing a refill on digoxin .125 mg because he was out. Last office note 04/23/12 pt was told to hold Digoxin. Pt is not sure if he should be taking or holding medication bc he has been taking it. Please advise.

## 2012-06-12 NOTE — Telephone Encounter (Signed)
Hold digoxin We need to determine which diuretics he is taking He was in renal failure Echocardiogram was ordered but not done yet Needs followup after echo At time of echo, need basic metabolic panel

## 2012-06-13 ENCOUNTER — Other Ambulatory Visit: Payer: Self-pay | Admitting: *Deleted

## 2012-06-13 DIAGNOSIS — I509 Heart failure, unspecified: Secondary | ICD-10-CM

## 2012-06-13 NOTE — Telephone Encounter (Signed)
Spoke with pt and he is aware to hold digoxin. He mentioned that he is taking spironolactone 25 mg qd as for diuretic. Pt was scheduled for echo on 07/23/12 2:00 pm. He is aware that he will need to follow up after echo and will need labs on day of appointment.

## 2012-06-13 NOTE — Telephone Encounter (Signed)
F/u after echo appoint. Schedule 07/29/12. Bmet ordered for future.

## 2012-06-13 NOTE — Telephone Encounter (Signed)
fyi

## 2012-06-16 ENCOUNTER — Other Ambulatory Visit: Payer: Self-pay | Admitting: Cardiovascular Disease

## 2012-07-11 HISTORY — PX: TRANSTHORACIC ECHOCARDIOGRAM: SHX275

## 2012-07-23 ENCOUNTER — Other Ambulatory Visit (INDEPENDENT_AMBULATORY_CARE_PROVIDER_SITE_OTHER): Payer: Medicare Other

## 2012-07-23 ENCOUNTER — Other Ambulatory Visit: Payer: Self-pay

## 2012-07-23 DIAGNOSIS — I509 Heart failure, unspecified: Secondary | ICD-10-CM

## 2012-07-23 DIAGNOSIS — I2510 Atherosclerotic heart disease of native coronary artery without angina pectoris: Secondary | ICD-10-CM

## 2012-07-23 DIAGNOSIS — I428 Other cardiomyopathies: Secondary | ICD-10-CM

## 2012-07-29 ENCOUNTER — Encounter: Payer: Self-pay | Admitting: Cardiovascular Disease

## 2012-07-29 ENCOUNTER — Ambulatory Visit (INDEPENDENT_AMBULATORY_CARE_PROVIDER_SITE_OTHER): Payer: Medicare Other | Admitting: Cardiovascular Disease

## 2012-07-29 VITALS — BP 110/82 | HR 95 | Ht 69.0 in | Wt 129.8 lb

## 2012-07-29 DIAGNOSIS — I5020 Unspecified systolic (congestive) heart failure: Secondary | ICD-10-CM

## 2012-07-29 DIAGNOSIS — I509 Heart failure, unspecified: Secondary | ICD-10-CM

## 2012-07-29 DIAGNOSIS — N19 Unspecified kidney failure: Secondary | ICD-10-CM

## 2012-07-29 MED ORDER — CARVEDILOL 12.5 MG PO TABS: 12.5000 mg | ORAL_TABLET | Freq: Two times a day (BID) | ORAL | Status: DC

## 2012-07-29 NOTE — Assessment & Plan Note (Signed)
He appears relatively euvolemic. JVP to 8-10 cm. No edema, no symptoms of heart failure. Basic metabolic panel checked

## 2012-07-29 NOTE — Progress Notes (Signed)
Patient ID: Isaac Collins, male    DOB: 09-Nov-1940, 72 y.o.   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  At the end of January 2013 he had weight gain, lower extremity edema, increasing shortness of breath,improved with aggressive diuresis.  He presents today for routine followup  Lab work over the past 6 months has shown renal failure with elevated BUN. We have been weaning his diuretics. He seems to be significant medication confusion. He does not bring his medications with him. It was felt that previously he was taking  torsemide, metolazone, Lasix. Uncertain why he was taking Lasix and metolazone on a regular basis.  He denies any edema, shortness of breath, PND or orthopnea. There is confusion about his carvedilol and enalapril today. Uncertain what medicines he is taking  Echocardiogram done May 2014 showing severely depressed ejection fraction, normal right ventricular systolic pressures, ejection fraction 20-25%  EKG today shows sinus rhythm with rate 95 beats per minute, nonspecific T wave abnormality, prolonged QT   Outpatient Encounter Prescriptions as of 07/29/2012  Medication Sig Dispense Refill  . allopurinol (ZYLOPRIM) 300 MG tablet Take 300 mg by mouth daily.      . carvedilol (COREG) 12.5 MG tablet Take 1 tablet (12.5 mg total) by mouth 2 (two) times daily with a meal.  60 tablet  11  . hydrALAZINE (APRESOLINE) 10 MG tablet Take 0.5 tablets (5 mg total) by mouth 2 (two) times daily.  30 tablet  6  . ibuprofen (ADVIL,MOTRIN) 400 MG tablet Take 400 mg by mouth every 6 (six) hours as needed.        Marland Kitchen spironolactone (ALDACTONE) 25 MG tablet Take 1 tablet (25 mg total) by mouth daily.  30 tablet  6  . torsemide (DEMADEX) 20 MG  tablet Take 2 tablets (40 mg total) by mouth daily.  60 tablet  11  .  carvedilol (COREG) 25 MG tablet Take 25 mg by mouth daily.      .  enalapril (VASOTEC) 5 MG tablet Take 1 tablet (5 mg total) by mouth 2 (two) times daily.I'm sure he is taking this   60 tablet  6  . sildenafil (VIAGRA) 100 MG tablet Take 1 tablet (100 mg total) by mouth daily as needed for erectile dysfunction.  2 tablet  5    Review of Systems  Constitutional: Negative.   HENT: Negative.   Eyes: Negative.   Respiratory: Negative.   Cardiovascular: Negative.   Gastrointestinal: Negative.   Musculoskeletal: Negative.   Skin: Negative.   Neurological: Negative.   Psychiatric/Behavioral: Negative.   All other systems reviewed and are negative.    BP 110/82  Pulse 95  Ht 5\' 9"  (1.753 m)  Wt 129 lb 12 oz (58.854 kg)  BMI 19.15 kg/m2  Physical Exam  Nursing note and vitals reviewed. Constitutional: He is oriented to person, place, and time. He appears well-developed and well-nourished.  HENT:  Head: Normocephalic.  Nose: Nose normal.  Mouth/Throat: Oropharynx is clear and moist.  Eyes: Conjunctivae are normal. Pupils are equal, round, and reactive to light.  Neck: Normal range of motion. Neck supple. No JVD present.  Cardiovascular: Normal rate, regular rhythm, S1 normal, S2 normal and intact distal pulses.  Exam reveals no gallop and  no friction rub.   Murmur heard.  Crescendo systolic murmur is present with a grade of 2/6  Pulmonary/Chest: Effort normal and breath sounds normal. No respiratory distress. He has no wheezes. He has no rales. He exhibits no tenderness.  Abdominal: Soft. Bowel sounds are normal. He exhibits no distension. There is no tenderness.  Musculoskeletal: Normal range of motion. He exhibits no edema and no tenderness.  Lymphadenopathy:    He has no cervical adenopathy.  Neurological: He is alert and oriented to person, place, and time. Coordination normal.  Skin: Skin is warm and dry.  No rash noted. No erythema.  Psychiatric: He has a normal mood and affect. His behavior is normal. Judgment and thought content normal.      Assessment and Plan

## 2012-07-29 NOTE — Patient Instructions (Addendum)
You are doing well. We will check the kidney number today  Please take coreg (carvedilol) 12.5 mg twice a day  Please call us if you have new issues that need to be addressed before your next appt.  Your physician wants you to follow-up in: 6 months.  You will receive a reminder letter in the mail two months in advance. If you don't receive a letter, please call our office to schedule the follow-up appointment.

## 2012-07-29 NOTE — Assessment & Plan Note (Signed)
Basic metabolic panel checked today. Uncertain if prior elevated creatinine from polypharmacy and too much diuretic. He try to clarify his diuretic dosing with him and he reports taking torsemide daily.

## 2012-07-30 LAB — BASIC METABOLIC PANEL
BUN/Creatinine Ratio: 27 — ABNORMAL HIGH (ref 10–22)
Calcium: 7.4 mg/dL — ABNORMAL LOW (ref 8.6–10.2)
Creatinine, Ser: 3.4 mg/dL — ABNORMAL HIGH (ref 0.76–1.27)
GFR calc non Af Amer: 17 mL/min/{1.73_m2} — ABNORMAL LOW (ref >59)
Potassium: 3.7 mmol/L (ref 3.5–5.2)
Sodium: 136 mmol/L (ref 134–144)

## 2012-10-09 ENCOUNTER — Other Ambulatory Visit: Payer: Self-pay | Admitting: Cardiovascular Disease

## 2012-10-09 NOTE — Telephone Encounter (Signed)
Refilled Spironolactone and Hydralazine sent to [facility] pharmacy.

## 2012-11-08 ENCOUNTER — Other Ambulatory Visit: Payer: Self-pay | Admitting: Cardiovascular Disease

## 2012-11-08 NOTE — Telephone Encounter (Signed)
lmtcb regarding metolazone refill. Medication is not on med list.

## 2013-01-28 ENCOUNTER — Ambulatory Visit: Payer: Medicare Other | Admitting: Cardiovascular Disease

## 2013-01-30 ENCOUNTER — Ambulatory Visit (INDEPENDENT_AMBULATORY_CARE_PROVIDER_SITE_OTHER): Payer: Medicare Other | Admitting: Cardiovascular Disease

## 2013-01-30 ENCOUNTER — Encounter: Payer: Self-pay | Admitting: Cardiovascular Disease

## 2013-01-30 VITALS — BP 104/82 | HR 100 | Ht 69.0 in | Wt 131.0 lb

## 2013-01-30 DIAGNOSIS — I509 Heart failure, unspecified: Secondary | ICD-10-CM

## 2013-01-30 DIAGNOSIS — I5020 Unspecified systolic (congestive) heart failure: Secondary | ICD-10-CM

## 2013-01-30 DIAGNOSIS — R000 Tachycardia, unspecified: Secondary | ICD-10-CM

## 2013-01-30 DIAGNOSIS — I5022 Chronic systolic (congestive) heart failure: Secondary | ICD-10-CM

## 2013-01-30 DIAGNOSIS — N19 Unspecified kidney failure: Secondary | ICD-10-CM

## 2013-01-30 NOTE — Assessment & Plan Note (Addendum)
He's not particularly interested in followup with renal physician. He is scared of dialysis. We have suggested he decrease his torsemide to 20 Daily, increase his fluid intake. We will repeat his blood work today

## 2013-01-30 NOTE — Assessment & Plan Note (Deleted)
He appears euvolemic, possibly dry. Neck veins, no edema, no symptoms of shortness of breath. Given renal failure over the past year, we have suggested he decrease the torsemide down to 20 mg daily. Encouraged him to see a renal physician  

## 2013-01-30 NOTE — Patient Instructions (Addendum)
You are doing well.  We will check blood work today , BMP  Please cut the torsemide down to one a day You could be "dry" causing the kidney number to climb higher. If you start to retain weight, get ankle swelling, take an extra torsemide  Please call us if you have new issues that need to be addressed before your next appt.  Your physician wants you to follow-up in: 3 months.  You will receive a reminder letter in the mail two months in advance. If you don't receive a letter, please call our office to schedule the follow-up appointment.

## 2013-01-30 NOTE — Progress Notes (Signed)
Patient ID: Isaac Collins, male    DOB: [birth year], 73 y.o.   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  At the end of January 2013 he had weight gain, lower extremity edema, increasing shortness of breath,improved with aggressive diuresis.  He presents today for routine followup.  In followup today, he reports that he feels well. He continues to take torsemide 40 mg daily. There is always confusion with which medications he is taking as he seems unfamiliar with these and his niece does most of his medications.. he reports that his sister recently had a stroke in August of 2014. He denies any shortness of breath, weight gain, chest pain or any symptoms. He is able to walk though with rapid walking he does have to stop and rest.   Lab work over the past year has shown renal failure with elevated BUN. He did not followup with the renal physician as we suggested.  Echocardiogram done May 2014 showing severely depressed ejection fraction, normal right ventricular systolic pressures, ejection fraction 20-25%  EKG today shows sinus rhythm with rate 99 beats per minute, nonspecific T wave abnormality, prolonged QT   Outpatient Encounter Prescriptions as of 01/30/2013  Medication Sig  . allopurinol (ZYLOPRIM) 300 MG tablet Take 300 mg by mouth daily.  . carvedilol (COREG) 12.5 MG tablet Take 1 tablet (12.5 mg total) by mouth 2 (two) times daily with a meal.  . hydrALAZINE (APRESOLINE) 10 MG tablet TAKE 1/2 TABLET (5 MG TOTAL) TWICE A DAY  . ibuprofen (ADVIL,MOTRIN) 400 MG tablet Take 400 mg by mouth every 6 (six) hours as needed.    . sildenafil (VIAGRA) 100 MG tablet Take 1 tablet (100 mg total) by mouth daily as needed for erectile  dysfunction.  Marland Kitchen spironolactone (ALDACTONE) 25 MG tablet TAKE 1 TABLET (25 MG TOTAL) BY MOUTH DAILY.  Marland Kitchen torsemide (DEMADEX) 20 MG tablet Take 2 tablets (40 mg total) by mouth daily.     Review of Systems  Constitutional: Negative.   HENT: Negative.   Eyes: Negative.   Respiratory: Positive for shortness of breath.   Cardiovascular: Negative.   Gastrointestinal: Negative.   Endocrine: Negative.   Musculoskeletal: Negative.   Skin: Negative.   Allergic/Immunologic: Negative.   Neurological: Negative.   Hematological: Negative.   Psychiatric/Behavioral: Negative.   All other systems reviewed and are negative.    BP 104/82  Pulse 100  Ht 5\' 9"  (1.753 m)  Wt 131 lb (59.421 kg)  BMI 19.34 kg/m2  Physical Exam  Nursing note and vitals reviewed. Constitutional: He is oriented to person, place, and time. He appears well-developed and well-nourished.  HENT:  Head: Normocephalic.  Nose: Nose normal.  Mouth/Throat: Oropharynx is clear and moist.  Eyes: Conjunctivae are normal. Pupils are equal, round, and reactive to light.  Neck: Normal range of motion. Neck supple. No JVD present.  Cardiovascular: Normal rate, regular rhythm, S1 normal, S2 normal and intact distal pulses.  Exam reveals no gallop and no friction rub.   Murmur heard.  Crescendo systolic murmur is present with a grade of 2/6  Pulmonary/Chest: Effort normal and breath sounds normal. No respiratory distress. He has no wheezes. He has no rales. He exhibits no tenderness.  Abdominal: Soft. Bowel sounds are normal.  He exhibits no distension. There is no tenderness.  Musculoskeletal: Normal range of motion. He exhibits no edema and no tenderness.  Lymphadenopathy:    He has no cervical adenopathy.  Neurological: He is alert and oriented to person, place, and time. Coordination normal.  Skin: Skin is warm and dry. No rash noted. No erythema.  Psychiatric: He has a normal mood and affect. His behavior is normal. Judgment  and thought content normal.      Assessment and Plan

## 2013-01-30 NOTE — Assessment & Plan Note (Signed)
He appears euvolemic, possibly dry. Neck veins, no edema, no symptoms of shortness of breath. Given renal failure over the past year, we have suggested he decrease the torsemide down to 20 mg daily. Encouraged him to see a renal physician

## 2013-02-03 ENCOUNTER — Telehealth: Payer: Self-pay

## 2013-02-03 NOTE — Telephone Encounter (Signed)
Spoke w/ pt.  He will let us know when he will be in town so that we can get another lab draw.

## 2013-02-03 NOTE — Telephone Encounter (Signed)
Message copied by Marilynne Halsted on Mon Feb 03, 2013 10:16 AM ------      Message from: Antonieta Iba      Created: Sun Feb 02, 2013  9:25 PM       We  Need a new sample.      Maybe when he is in town again, come in for [facility] ------

## 2013-02-18 ENCOUNTER — Other Ambulatory Visit: Payer: Self-pay | Admitting: Cardiovascular Disease

## 2013-02-18 ENCOUNTER — Other Ambulatory Visit: Payer: Self-pay | Admitting: *Deleted

## 2013-02-18 MED ORDER — SPIRONOLACTONE 25 MG PO TABS: ORAL_TABLET | ORAL | Status: DC

## 2013-02-18 NOTE — Telephone Encounter (Signed)
Requested Prescriptions   Signed Prescriptions Disp Refills  . spironolactone (ALDACTONE) 25 MG tablet 30 tablet 4    Sig: TAKE 1 TABLET (25 MG TOTAL) BY MOUTH DAILY.    Authorizing Provider: Antonieta Iba    Ordering User: Kendrick Fries

## 2013-03-24 ENCOUNTER — Telehealth: Payer: Self-pay

## 2013-03-24 NOTE — Telephone Encounter (Signed)
Spoke w/ pt.  He reports that he has increased fluid, wt is up, sob, legs are swelling. Reports that he has not seen a kidney doctor yet, as he has had a lot on his mind and has not thought about it further since last ov. Reports that he has been taking his regular meds, torsemide 20 mg BID, but does not feel that he is urinating, reports it is "just a little, by chance". Pt asked for appt to see Dr. Mariah Milling but reports that Wednesday is the first time he can come in, as his family can only bring him an am. Please advise.  Thank you!

## 2013-03-24 NOTE — Telephone Encounter (Signed)
10 Am on Wednesday is ok

## 2013-03-24 NOTE — Telephone Encounter (Signed)
Pt called wanting an appt, states "fluid is building up", could not come in to see Dr. Mariah Milling until Wednesday. Please call.

## 2013-03-26 ENCOUNTER — Encounter: Payer: Self-pay | Admitting: Cardiology

## 2013-03-26 ENCOUNTER — Ambulatory Visit (INDEPENDENT_AMBULATORY_CARE_PROVIDER_SITE_OTHER): Payer: Medicare Other | Admitting: Cardiology

## 2013-03-26 ENCOUNTER — Ambulatory Visit: Payer: Medicare Other | Admitting: Cardiovascular Disease

## 2013-03-26 VITALS — BP 111/82 | HR 102 | Ht 69.0 in | Wt 147.2 lb

## 2013-03-26 DIAGNOSIS — R000 Tachycardia, unspecified: Secondary | ICD-10-CM

## 2013-03-26 DIAGNOSIS — N184 Chronic kidney disease, stage 4 (severe): Secondary | ICD-10-CM

## 2013-03-26 DIAGNOSIS — N19 Unspecified kidney failure: Secondary | ICD-10-CM

## 2013-03-26 DIAGNOSIS — I428 Other cardiomyopathies: Secondary | ICD-10-CM

## 2013-03-26 DIAGNOSIS — I420 Dilated cardiomyopathy: Secondary | ICD-10-CM

## 2013-03-26 DIAGNOSIS — N2889 Other specified disorders of kidney and ureter: Secondary | ICD-10-CM

## 2013-03-26 DIAGNOSIS — R011 Cardiac murmur, unspecified: Secondary | ICD-10-CM

## 2013-03-26 DIAGNOSIS — I509 Heart failure, unspecified: Secondary | ICD-10-CM

## 2013-03-26 DIAGNOSIS — I5042 Chronic combined systolic (congestive) and diastolic (congestive) heart failure: Secondary | ICD-10-CM

## 2013-03-26 DIAGNOSIS — I5043 Acute on chronic combined systolic (congestive) and diastolic (congestive) heart failure: Secondary | ICD-10-CM

## 2013-03-26 MED ORDER — HYDRALAZINE HCL 10 MG PO TABS: 10.0000 mg | ORAL_TABLET | Freq: Two times a day (BID) | ORAL | Status: DC

## 2013-03-26 MED ORDER — TORSEMIDE 20 MG PO TABS: ORAL_TABLET | ORAL | Status: DC

## 2013-03-26 NOTE — Patient Instructions (Addendum)
Your physician recommends that you return for lab work in: Today and Friday 03/28/13 CMP MAG PBNP  Your physician has recommended you make the following change in your medication:  Demadex (torsemide) 4 tabs (20mg  tabs that you have at home) twice a day starting this afternoon until Friday afternoon (before 5 pm). Then 4 in the morning and 2 in the afternoon until seen back early next week  Stop Viagra   Your physician recommends that you schedule a follow-up appointment in:  Early next week

## 2013-03-27 ENCOUNTER — Telehealth: Payer: Self-pay

## 2013-03-27 LAB — COMPREHENSIVE METABOLIC PANEL
A/G RATIO: 1.3 (ref 1.1–2.5)
ALT: 16 IU/L (ref 0–44)
AST: 19 IU/L (ref 0–40)
Albumin: 3.4 g/dL — ABNORMAL LOW (ref 3.5–4.8)
Alkaline Phosphatase: 130 IU/L — ABNORMAL HIGH (ref 39–117)
BUN/Creatinine Ratio: 19 (ref 10–22)
BUN: 61 mg/dL — ABNORMAL HIGH (ref 8–27)
CALCIUM: 8.5 mg/dL — AB (ref 8.6–10.2)
CO2: 20 mmol/L (ref 18–29)
CREATININE: 3.29 mg/dL — AB (ref 0.76–1.27)
Chloride: 100 mmol/L (ref 97–108)
GFR calc Af Amer: 21 mL/min/{1.73_m2} — ABNORMAL LOW (ref >59)
GFR, EST NON AFRICAN AMERICAN: 18 mL/min/{1.73_m2} — AB (ref >59)
GLUCOSE: 100 mg/dL — AB (ref 65–99)
Globulin, Total: 2.6 g/dL (ref 1.5–4.5)
Potassium: 4.6 mmol/L (ref 3.5–5.2)
Sodium: 140 mmol/L (ref 134–144)
TOTAL PROTEIN: 6 g/dL (ref 6.0–8.5)
Total Bilirubin: 2 mg/dL — ABNORMAL HIGH (ref 0.0–1.2)

## 2013-03-27 LAB — PRO B NATRIURETIC PEPTIDE: NT-PRO BNP: 16204 pg/mL — AB (ref 0–124)

## 2013-03-27 LAB — MAGNESIUM: MAGNESIUM: 1.5 mg/dL — AB (ref 1.6–2.6)

## 2013-03-27 NOTE — Telephone Encounter (Signed)
Pt called stating that he does not feel that the increase in his torsemide that was made yesterday is working. Reports that he has not urinated yet, despite starting 4 tabs torsemide yesterday evening and again this morning.  Pt had labs drawn yesterday and kidney function is abnormal.  Pt's wt has not come down since yesterday.  Please advise.  Thank you!

## 2013-03-27 NOTE — Telephone Encounter (Signed)
Have him try taking a total of 120 mg of torsemide twice a day (for the next dose in both doses tomorrow). If he does not urinate after that, he will probably need to be admitted for IV diuresis.    I suspect, that he will end up being admitted.  Marykay Lex, MD

## 2013-03-27 NOTE — Telephone Encounter (Signed)
Spoke w/ pt.  He is agreeable to increasing his torsemide until tomorrow.  He states that he does not want to go to the hospital. Pt inquired about lab results and I discussed at length his kidney function and fluid retention with him and stressed the importance of him seeing a kidney doctor.  Advised pt that if he has not urinated by tomorrow to go to the ED and that I will call him in the morning to check on him. He verbalizes understanding and is agreeable to plan.

## 2013-03-27 NOTE — Telephone Encounter (Signed)
Pt niece called states pt has taken 80 mg of torsemide, pt states he is to take 6 more today. Needs clarification. Please call.

## 2013-03-27 NOTE — Telephone Encounter (Signed)
Spoke w/ Carlene.  Clarified instructions w/ her and she reports that pt's sister will be with him at appt tomorrow for labs.  Advised her that if pt's symptoms have not improved by the time he gets here, we will send him directly to ED. She is agreeable to this and will make pt's sister aware.

## 2013-03-28 ENCOUNTER — Encounter: Payer: Self-pay | Admitting: Cardiology

## 2013-03-28 ENCOUNTER — Inpatient Hospital Stay: Payer: Self-pay | Admitting: Internal Medicine

## 2013-03-28 ENCOUNTER — Other Ambulatory Visit: Payer: Medicare Other

## 2013-03-28 DIAGNOSIS — I420 Dilated cardiomyopathy: Secondary | ICD-10-CM | POA: Insufficient documentation

## 2013-03-28 DIAGNOSIS — N184 Chronic kidney disease, stage 4 (severe): Secondary | ICD-10-CM | POA: Insufficient documentation

## 2013-03-28 DIAGNOSIS — I5042 Chronic combined systolic (congestive) and diastolic (congestive) heart failure: Secondary | ICD-10-CM | POA: Insufficient documentation

## 2013-03-28 LAB — COMPREHENSIVE METABOLIC PANEL
ALT: 21 U/L (ref 12–78)
Albumin: 3.1 g/dL — ABNORMAL LOW (ref 3.4–5.0)
Alkaline Phosphatase: 141 U/L — ABNORMAL HIGH
Anion Gap: 10 (ref 7–16)
BILIRUBIN TOTAL: 1.8 mg/dL — AB (ref 0.2–1.0)
BUN: 66 mg/dL — AB (ref 7–18)
CHLORIDE: 104 mmol/L (ref 98–107)
CO2: 25 mmol/L (ref 21–32)
Calcium, Total: 8.8 mg/dL (ref 8.5–10.1)
Creatinine: 3.11 mg/dL — ABNORMAL HIGH (ref 0.60–1.30)
EGFR (African American): 22 — ABNORMAL LOW
EGFR (Non-African Amer.): 19 — ABNORMAL LOW
GLUCOSE: 111 mg/dL — AB (ref 65–99)
OSMOLALITY: 297 (ref 275–301)
Potassium: 4.1 mmol/L (ref 3.5–5.1)
SGOT(AST): 21 U/L (ref 15–37)
SODIUM: 139 mmol/L (ref 136–145)
Total Protein: 6.8 g/dL (ref 6.4–8.2)

## 2013-03-28 LAB — CBC
HCT: 41 % (ref 40.0–52.0)
HGB: 13.3 g/dL (ref 13.0–18.0)
MCH: 28.6 pg (ref 26.0–34.0)
MCHC: 32.4 g/dL (ref 32.0–36.0)
MCV: 88 fL (ref 80–100)
Platelet: 153 10*3/uL (ref 150–440)
RBC: 4.65 10*6/uL (ref 4.40–5.90)
RDW: 16.4 % — ABNORMAL HIGH (ref 11.5–14.5)
WBC: 7.7 10*3/uL (ref 3.8–10.6)

## 2013-03-28 LAB — URINALYSIS, COMPLETE
BILIRUBIN, UR: NEGATIVE
BLOOD: NEGATIVE
Bacteria: NONE SEEN
Glucose,UR: NEGATIVE mg/dL (ref 0–75)
KETONE: NEGATIVE
Leukocyte Esterase: NEGATIVE
NITRITE: NEGATIVE
Ph: 7 (ref 4.5–8.0)
Protein: NEGATIVE
RBC, UR: NONE SEEN /HPF (ref 0–5)
SQUAMOUS EPITHELIAL: NONE SEEN
Specific Gravity: 1.005 (ref 1.003–1.030)
WBC UR: NONE SEEN /HPF (ref 0–5)

## 2013-03-28 LAB — PRO B NATRIURETIC PEPTIDE: B-Type Natriuretic Peptide: 25086 pg/mL — ABNORMAL HIGH (ref 0–125)

## 2013-03-28 LAB — TROPONIN I

## 2013-03-28 NOTE — Telephone Encounter (Signed)
If he is not responding, the best is to go to the ED.

## 2013-03-28 NOTE — Telephone Encounter (Signed)
141 is 6 pounds down. Could be worse.  I figured that he would need IV diuretics.  Worth a try.  This way, he can get back in with nephrology.  Needs more education of checking daily weights etc.  Marykay Lex, MD

## 2013-03-28 NOTE — Telephone Encounter (Signed)
Pt is currently in [facility] ED, orders for stat labs and diuresis.

## 2013-03-28 NOTE — Assessment & Plan Note (Signed)
Compensatory with low EF. I would not increase his beta blocker at this time.

## 2013-03-28 NOTE — Telephone Encounter (Signed)
Pt presented for labs today weighing 141 lbs.  Pt reports that he took 120mg  torsemide last night and again this am and states "it ain't gonna come off of me overnight". Reviewed labs w/ pt and stressed the importance of going to the ED for IV diuresis, as well as reiterating the need for him to see a kidney specialist.  He states that he would like to go home first to "cut some lights off" as he is a "single man". Advised him that I recommend that he go over to the ED now. Printed lab results and last ov for him to take w/ him. Pt's niece is present w/ him and states that she will try to get him to go over now.

## 2013-03-28 NOTE — Assessment & Plan Note (Addendum)
He definitely is fearful of dialysis, I don't note his EF would tolerate dialysis. Unfortunately his current insufficiency, he simply needs higher doses. That in addition to gut edema leading to poor absorption of the Demadex. I will try to 80 twice a day and torsemide/Demadex for now and if that is not effective would increase to 120 twice a day. He may eventually require addition of metolazone. I anticipate that he would potentially have to be hospitalized prior to trying it.

## 2013-03-28 NOTE — Telephone Encounter (Signed)
Pt coming in for labs today at 11:00. Will discuss with him at that time.

## 2013-03-28 NOTE — Assessment & Plan Note (Signed)
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 It has been 7 years since his [employer] last significant hospitalization I can tell. My impression is that he just needs constant close following and reaeration of the importance of daily weights and adjusting his diuretic dose accordingly.  I think his current decompensation is related to issues: #1 his Demadex dose was reduced, without the option and his mind to increase again; #2 would be since his last visit there has been Thanksgiving as well as Christmas holidays and likely dietary indiscretion which is caregiver the present agrees.

## 2013-03-28 NOTE — Progress Notes (Signed)
PATIENT: Isaac Collins MRN: [id number]  DOB: Aug 28, 1940   DOV:03/26/2013 PCP: Tracie Harrier, MD  Clinic Note: Chief Complaint  Patient presents with  . OTHER    C/o fluid retention and sob. Meds reviewed verbally with pt.   HPI: Isaac Collins is a 73 y.o.  male with a PMH below who presents today for worsening edema, weight gain and dyspnea. He has a long-standing history of a nonischemic cardiomyopathy since 2008 plan he was admitted for severe decompensation requiring dobutamine infusion and aggressive diuresis. He was last admitted to [facility] in 2008. He's been doing relatively well with aggressive management. Apparently he had a history of heavy alcohol abuse that was the likely culprit etiology for his heart failure.  Interval History: He is a patient of Dr. Rockey Situ, who I am seeing in the acute followup of his nonischemic cardiomyopathy. He is known EF of less than 25%. In fact he was actually placed on hospice in 2000 at, but continues to do relatively well. He was last seen in November, and there was concern the potentially dehydrated. Therefore his torsemide dose was reduced. It appeared that the patient himself has a relatively poor insight as to the management of his heart failure, as he has a lot of results become 16 pounds overweight over last 2 months. He now presents with significant lower edema and increased abdominal girth. It was not until he became somewhat dyspneic we decided to come to clinic. He still denies significant PND orthopnea until the last couple days. He still a little a flat but has noted that he will wake up sometimes short of breath. He denies any chest tightness or pressure. No palpitations or irregular heartbeats. No lightheadedness dizziness or wooziness. No syncope near syncope no TIA or amaurosis he asked symptoms.  He has not necessarily noted any change in his dietary intake, with no nausea or vomiting. He is starting to notice that the tingling  discomfort in his legs especially his been standing for long time. The one he does notice that his urine output with his torsemide dose is not brisk it used to be.  He denies any melena, hematochezia or hematuria.  Past Medical History  Diagnosis Date  . Nonischemic dilated cardiomyopathy     Normal coronary arteries by cath in 2008  . Chronic combined systolic and diastolic CHF, NYHA class 3     severe; EF roughly 77% by echo; diastolic dysfunction NOS  . HTN (hypertension)   . HLD (hyperlipidemia)   . Chronic renal insufficiency, stage IV (severe)     Baseline creatinine roughly 3.3; GFR 15-20    Prior Cardiac Evaluation and Past Surgical History: Past Surgical History  Procedure Laterality Date  . Cardiac catheterization  April 2008     Angiographically normal coronary arteries. Cardiac Output of Roughly 3.6L/min Index was 1.4 L/min/m2.  At this time the echo revealed EF of roughly 10%.  . Transthoracic echocardiogram  April 2008    EF of roughly 10%. Severe TR.  . Transthoracic echocardiogram  May 2014    EF 20-25%. Severe LV dilation. Diffuse hypokinesis. Diastolic dysfunction NOS. Mild MR. Trivial TR.    No Known Allergies  Current Outpatient Prescriptions  Medication Sig Dispense Refill  . allopurinol (ZYLOPRIM) 300 MG tablet Take 300 mg by mouth daily.      . carvedilol (COREG) 12.5 MG tablet Take 1 tablet (12.5 mg total) by mouth 2 (two) times daily with a meal.  60 tablet  11  .  ibuprofen (ADVIL,MOTRIN) 400 MG tablet Take 400 mg by mouth every 6 (six) hours as needed.        Marland Kitchen spironolactone (ALDACTONE) 25 MG tablet TAKE 1 TABLET (25 MG TOTAL) BY MOUTH DAILY.  30 tablet  4  . torsemide (DEMADEX) 20 MG tablet 20 mg tablets, take as the doctor ordered --  Time of visit, he was taking 40 mg once daily   60 tablet  6  . hydrALAZINE (APRESOLINE) 10 MG tablet Take 1/2 tablet (5 mg total) by mouth 2 (two) times daily.  60 tablet  3   No current facility-administered  medications for this visit.    History   Social History Narrative   Is a single gentleman who is accompanied here by a woman he believes his sister. He lives alone, and manages her medications. He continues to smoke daily 1 pack a day. He is a former heavy alcohol drinker, but is now no longer drinking. At baseline he is able to walk around and do his day-to-day activities, does not exercise.   ROS: A comprehensive Review of Systems - Negative except Symptoms noted above.  PHYSICAL EXAM BP 111/82  Pulse 102  Ht '5\' 9"'  (1.753 m)  Wt 147 lb 4 oz (66.792 kg)  BMI 21.74 kg/m2 General appearance: alert, cooperative, appears stated age, no distress and Relatively poor insight. Well-groomed Neck: JVD - several cm above sternal notch, no carotid bruit and supple, symmetrical, trachea midline Lungs: clear to auscultation bilaterally, normal percussion bilaterally and Nonlabored. Notably no rales after coughing Heart: prominent apical impulse, regular rate and rhythm, S1, S2 normal, S4 present and 2/6 SM at the left sternal border; no diastolic murmur Abdomen: Mildly distended, protuberant abdomen with some subcutaneous edema.   Nontender. Not truly distended.. NABS; mild hepatomegaly noted. Did not palpate splenomegaly Extremities: edema 3+ up to thighs and venous stasis dermatitis noted Pulses: Faint distal pulses mostly because of edema. Skin: Dry at scaly skin with mild venous stasis dermatitis noted in the lower extremities. Neurologic: Grossly normal  [id number] today: Yes Rate: 102 , Rhythm: Sinus tachycardia;  right atrial enlargement, occasional PVC. Repolarization changes  Recent Labs: Labs ordered today reviewed in [facility].  ASSESSMENT / PLAN: Acute on chronic combined systolic and diastolic CHF, NYHA class 3 Not overly symptomatic, but significant weight gain of 16 pounds since his last visit. He densely has mostly lower extremity edema suggestive of more right-sided failure. He  doesn't have much PND orthopnea. Therefore it does not seem to be as "sick"as he could be. I discussed options of either having him admitted to the hospital for IV diuresis versus the potential for increasing his oral diuretic with close outpatient monitoring. He clearly did not want to go the hospital.  Therefore we get with the plan as follows:  Check complete chemistry panel and proBNP today --> then again on Friday  Demadex (torsemide) 4 tabs (43m tabs that you have at home) twice a day starting this afternoon until Friday afternoon (before 5 pm).  Then 4 in the morning and 2 in the afternoon until seen back early next week   Stop Viagra   Increase hydralazine to a full 10 mg 3 times a day for additional afterload reduction   If this plan does not work, then we would consider admission to the hospital for IV diuretic. He should note increased urine output and weight loss. His target weight is 16 pounds down from when he weighs currently.  He will need  close followup next week, if not admitted.  Nonischemic dilated cardiomyopathy This is a long-standing condition his head. It has been 7 years since his [place] last significant hospitalization I can tell. My impression is that he just needs constant close following and reaeration of the importance of daily weights and adjusting his diuretic dose accordingly.  I think his current decompensation is related to issues: #1 his Demadex dose was reduced, without the option and his mind to increase again; #2 would be since his last visit there has been Thanksgiving as well as Christmas holidays and likely dietary indiscretion which is caregiver the present agrees.  Renal failure He definitely is fearful of dialysis, I don't note his EF would tolerate dialysis. Unfortunately his current insufficiency, he simply needs higher doses. That in addition to gut edema leading to poor absorption of the Demadex. I will try to 80 twice a day and  torsemide/Demadex for now and if that is not effective would increase to 120 twice a day. He may eventually require addition of metolazone. I anticipate that he would potentially have to be hospitalized prior to trying it.  Tachycardia Compensatory with low EF. I would not increase his beta blocker at this time.    Orders Placed This Encounter  Procedures  . Pro b natriuretic peptide  . Magnesium  . Magnesium    Standing Status: Future     Number of Occurrences:      Standing Expiration Date: 03/26/2014  . Pro b natriuretic peptide    Standing Status: Future     Number of Occurrences:      Standing Expiration Date: 03/26/2014  . Comp Met (CMET)    Standing Status: Future     Number of Occurrences:      Standing Expiration Date: 03/26/2014  . Comp Met (CMET)  . EKG 12-Lead    Order Specific Question:  Where should this test be performed    Answer:  [facility]   Meds ordered this encounter  Medications  . hydrALAZINE (APRESOLINE) 10 MG tablet    Sig: Take 1 tablet (10 mg total) by mouth 2 (two) times daily.    Dispense:  60 tablet    Refill:  3  . torsemide (DEMADEX) 20 MG tablet    Sig: 20 mg tablets, take as the doctor ordered Demadex (torsemide) 4 tabs (62m tabs that you have at home) twice a day starting this afternoon until Friday afternoon (before 5 pm). Then 4 in the morning and 2 in the afternoon until seen back early next week    Dispense:  60 tablet    Refill:  6   Mr. VDolinis a relatively compensated gentleman with acute on chronic exacerbation of his combined heart failure. Greater than 45 minutes was spent in direct patient consultation developing a plan in order to try to keep it from being in the hospital. Counseling is given teaching was done as to appropriate management.  Followup: Next week preferably Monday or Tuesday if not with me on Wednesday.  Tishawna Larouche W. HEllyn Hack M.D., M.S. [facility] [address] [place]  [phone] # [phone]

## 2013-03-28 NOTE — Assessment & Plan Note (Addendum)
Not overly symptomatic, but significant weight gain of 16 pounds since his last visit. He densely has mostly lower extremity edema suggestive of more right-sided failure. He doesn't have much PND orthopnea. Therefore it does not seem to be as "sick"as he could be. I discussed options of either having him admitted to the hospital for IV diuresis versus the potential for increasing his oral diuretic with close outpatient monitoring. He clearly did not want to go the hospital.  Therefore we get with the plan as follows:  Check complete chemistry panel and proBNP today --> then again on Friday  Demadex (torsemide) 4 tabs (20mg  tabs that you have at home) twice a day starting this afternoon until Friday afternoon (before 5 pm).  Then 4 in the morning and 2 in the afternoon until seen back early next week   Stop Viagra   Increase hydralazine to a full 10 mg 3 times a day for additional afterload reduction   If this plan does not work, then we would consider admission to the hospital for IV diuretic. He should note increased urine output and weight loss. His target weight is 16 pounds down from when he weighs currently.  He will need close followup next week, if not admitted.

## 2013-03-29 DIAGNOSIS — I5023 Acute on chronic systolic (congestive) heart failure: Secondary | ICD-10-CM

## 2013-03-29 LAB — BASIC METABOLIC PANEL
ANION GAP: 5 — AB (ref 7–16)
Anion Gap: 7 (ref 7–16)
BUN: 64 mg/dL — ABNORMAL HIGH (ref 7–18)
BUN: 63 mg/dL — ABNORMAL HIGH (ref 7–18)
CHLORIDE: 99 mmol/L (ref 98–107)
Calcium, Total: 9.1 mg/dL (ref 8.5–10.1)
Calcium, Total: 8.3 mg/dL — ABNORMAL LOW (ref 8.5–10.1)
Chloride: 100 mmol/L (ref 98–107)
Co2: 29 mmol/L (ref 21–32)
Co2: 28 mmol/L (ref 21–32)
Creatinine: 3.04 mg/dL — ABNORMAL HIGH (ref 0.60–1.30)
Creatinine: 3.21 mg/dL — ABNORMAL HIGH (ref 0.60–1.30)
EGFR (African American): 23 — ABNORMAL LOW
EGFR (African American): 21 — ABNORMAL LOW
GFR CALC NON AF AMER: 20 — AB
GFR CALC NON AF AMER: 19 — AB
Glucose: 103 mg/dL — ABNORMAL HIGH (ref 65–99)
Glucose: 136 mg/dL — ABNORMAL HIGH (ref 65–99)
OSMOLALITY: 289 (ref 275–301)
Osmolality: 286 (ref 275–301)
POTASSIUM: 4.2 mmol/L (ref 3.5–5.1)
Potassium: 4.1 mmol/L (ref 3.5–5.1)
SODIUM: 135 mmol/L — AB (ref 136–145)
SODIUM: 133 mmol/L — AB (ref 136–145)

## 2013-03-29 LAB — CBC WITH DIFFERENTIAL/PLATELET
BASOS ABS: 0.1 10*3/uL (ref 0.0–0.1)
BASOS PCT: 0.8 %
EOS ABS: 0.1 10*3/uL (ref 0.0–0.7)
Eosinophil %: 0.5 %
HCT: 40.8 % (ref 40.0–52.0)
HGB: 13.1 g/dL (ref 13.0–18.0)
Lymphocyte #: 0.9 10*3/uL — ABNORMAL LOW (ref 1.0–3.6)
Lymphocyte %: 7.2 %
MCH: 28.4 pg (ref 26.0–34.0)
MCHC: 32.1 g/dL (ref 32.0–36.0)
MCV: 88 fL (ref 80–100)
Monocyte #: 0.9 x10 3/mm (ref 0.2–1.0)
Monocyte %: 7.5 %
NEUTROS ABS: 10.3 10*3/uL — AB (ref 1.4–6.5)
NEUTROS PCT: 84 %
Platelet: 159 10*3/uL (ref 150–440)
RBC: 4.62 10*6/uL (ref 4.40–5.90)
RDW: 16.4 % — AB (ref 11.5–14.5)
WBC: 12.3 10*3/uL — AB (ref 3.8–10.6)

## 2013-03-29 LAB — MAGNESIUM: MAGNESIUM: 1.3 mg/dL — AB

## 2013-03-30 DIAGNOSIS — I4729 Other ventricular tachycardia: Secondary | ICD-10-CM

## 2013-03-30 DIAGNOSIS — I472 Ventricular tachycardia: Secondary | ICD-10-CM

## 2013-03-30 LAB — CBC WITH DIFFERENTIAL/PLATELET
BASOS ABS: 0.1 10*3/uL (ref 0.0–0.1)
Basophil %: 0.7 %
EOS ABS: 0.1 10*3/uL (ref 0.0–0.7)
Eosinophil %: 0.6 %
HCT: 44.9 % (ref 40.0–52.0)
HGB: 14.6 g/dL (ref 13.0–18.0)
LYMPHS PCT: 11.6 %
Lymphocyte #: 1.4 10*3/uL (ref 1.0–3.6)
MCH: 28.6 pg (ref 26.0–34.0)
MCHC: 32.5 g/dL (ref 32.0–36.0)
MCV: 88 fL (ref 80–100)
MONO ABS: 1.2 x10 3/mm — AB (ref 0.2–1.0)
MONOS PCT: 10.1 %
NEUTROS ABS: 9.1 10*3/uL — AB (ref 1.4–6.5)
Neutrophil %: 77 %
PLATELETS: 169 10*3/uL (ref 150–440)
RBC: 5.1 10*6/uL (ref 4.40–5.90)
RDW: 16.3 % — AB (ref 11.5–14.5)
WBC: 11.8 10*3/uL — ABNORMAL HIGH (ref 3.8–10.6)

## 2013-03-30 LAB — DIGOXIN LEVEL: DIGOXIN: 0.4 ng/mL

## 2013-03-30 LAB — BASIC METABOLIC PANEL
Anion Gap: 8 (ref 7–16)
BUN: 59 mg/dL — ABNORMAL HIGH (ref 7–18)
CALCIUM: 9.2 mg/dL (ref 8.5–10.1)
CO2: 31 mmol/L (ref 21–32)
Chloride: 96 mmol/L — ABNORMAL LOW (ref 98–107)
Creatinine: 2.98 mg/dL — ABNORMAL HIGH (ref 0.60–1.30)
EGFR (African American): 24 — ABNORMAL LOW
GFR CALC NON AF AMER: 20 — AB
GLUCOSE: 110 mg/dL — AB (ref 65–99)
Osmolality: 287 (ref 275–301)
POTASSIUM: 3.7 mmol/L (ref 3.5–5.1)
Sodium: 135 mmol/L — ABNORMAL LOW (ref 136–145)

## 2013-03-30 LAB — MAGNESIUM: MAGNESIUM: 2.1 mg/dL

## 2013-03-31 ENCOUNTER — Ambulatory Visit: Payer: Medicare Other | Admitting: Physician Assistant

## 2013-03-31 LAB — CBC WITH DIFFERENTIAL/PLATELET
BASOS ABS: 0.1 10*3/uL (ref 0.0–0.1)
BASOS PCT: 0.6 %
Eosinophil #: 0.1 10*3/uL (ref 0.0–0.7)
Eosinophil %: 0.6 %
HCT: 50.4 % (ref 40.0–52.0)
HGB: 16.5 g/dL (ref 13.0–18.0)
LYMPHS ABS: 1.3 10*3/uL (ref 1.0–3.6)
Lymphocyte %: 12.8 %
MCH: 29.6 pg (ref 26.0–34.0)
MCHC: 32.7 g/dL (ref 32.0–36.0)
MCV: 90 fL (ref 80–100)
Monocyte #: 0.9 x10 3/mm (ref 0.2–1.0)
Monocyte %: 8.6 %
Neutrophil #: 7.7 10*3/uL — ABNORMAL HIGH (ref 1.4–6.5)
Neutrophil %: 77.4 %
Platelet: 178 10*3/uL (ref 150–440)
RBC: 5.58 10*6/uL (ref 4.40–5.90)
RDW: 16.7 % — ABNORMAL HIGH (ref 11.5–14.5)
WBC: 10 10*3/uL (ref 3.8–10.6)

## 2013-03-31 LAB — PHOSPHORUS: PHOSPHORUS: 3.4 mg/dL (ref 2.5–4.9)

## 2013-03-31 LAB — URINALYSIS, COMPLETE
Bacteria: NONE SEEN
Bilirubin,UR: NEGATIVE
GLUCOSE, UR: NEGATIVE mg/dL (ref 0–75)
Ketone: NEGATIVE
NITRITE: NEGATIVE
PH: 6 (ref 4.5–8.0)
PROTEIN: NEGATIVE
Specific Gravity: 1.009 (ref 1.003–1.030)
Squamous Epithelial: NONE SEEN
WBC UR: 5 /HPF (ref 0–5)

## 2013-03-31 LAB — BASIC METABOLIC PANEL
Anion Gap: 6 — ABNORMAL LOW (ref 7–16)
BUN: 63 mg/dL — ABNORMAL HIGH (ref 7–18)
CALCIUM: 9.2 mg/dL (ref 8.5–10.1)
CO2: 32 mmol/L (ref 21–32)
Chloride: 97 mmol/L — ABNORMAL LOW (ref 98–107)
Creatinine: 3.24 mg/dL — ABNORMAL HIGH (ref 0.60–1.30)
EGFR (Non-African Amer.): 18 — ABNORMAL LOW
GFR CALC AF AMER: 21 — AB
GLUCOSE: 115 mg/dL — AB (ref 65–99)
OSMOLALITY: 289 (ref 275–301)
POTASSIUM: 3.7 mmol/L (ref 3.5–5.1)
SODIUM: 135 mmol/L — AB (ref 136–145)

## 2013-04-01 ENCOUNTER — Telehealth: Payer: Self-pay

## 2013-04-01 LAB — CBC WITH DIFFERENTIAL/PLATELET
BASOS ABS: 0.1 10*3/uL (ref 0.0–0.1)
BASOS PCT: 0.8 %
Eosinophil #: 0.1 10*3/uL (ref 0.0–0.7)
Eosinophil %: 1.9 %
HCT: 42.4 % (ref 40.0–52.0)
HGB: 13.8 g/dL (ref 13.0–18.0)
LYMPHS ABS: 1.5 10*3/uL (ref 1.0–3.6)
LYMPHS PCT: 21.8 %
MCH: 29.4 pg (ref 26.0–34.0)
MCHC: 32.5 g/dL (ref 32.0–36.0)
MCV: 91 fL (ref 80–100)
Monocyte #: 0.8 x10 3/mm (ref 0.2–1.0)
Monocyte %: 11.7 %
NEUTROS ABS: 4.5 10*3/uL (ref 1.4–6.5)
Neutrophil %: 63.8 %
PLATELETS: 161 10*3/uL (ref 150–440)
RBC: 4.68 10*6/uL (ref 4.40–5.90)
RDW: 16.3 % — ABNORMAL HIGH (ref 11.5–14.5)
WBC: 7 10*3/uL (ref 3.8–10.6)

## 2013-04-01 LAB — BASIC METABOLIC PANEL
Anion Gap: 4 — ABNORMAL LOW (ref 7–16)
BUN: 56 mg/dL — ABNORMAL HIGH (ref 7–18)
CHLORIDE: 95 mmol/L — AB (ref 98–107)
Calcium, Total: 8.2 mg/dL — ABNORMAL LOW (ref 8.5–10.1)
Co2: 33 mmol/L — ABNORMAL HIGH (ref 21–32)
Creatinine: 2.91 mg/dL — ABNORMAL HIGH (ref 0.60–1.30)
EGFR (African American): 24 — ABNORMAL LOW
EGFR (Non-African Amer.): 21 — ABNORMAL LOW
GLUCOSE: 84 mg/dL (ref 65–99)
Osmolality: 279 (ref 275–301)
Potassium: 3.7 mmol/L (ref 3.5–5.1)
SODIUM: 132 mmol/L — AB (ref 136–145)

## 2013-04-01 LAB — URINE CULTURE

## 2013-04-01 NOTE — Telephone Encounter (Signed)
Attempted to contact pt regarding discharge from [facility] 04/01/13  Left detailed message asking pt to call w/ any questions about discharge instructions and/or medications. Advised pt of appt w/ Dr. Mariah Milling 04/07/13 @ 1:30.

## 2013-04-03 LAB — PROTEIN ELECTROPHORESIS(ARMC)

## 2013-04-07 ENCOUNTER — Encounter: Payer: Medicare Other | Admitting: Cardiovascular Disease

## 2013-04-15 ENCOUNTER — Encounter: Payer: Medicare Other | Admitting: Cardiovascular Disease

## 2013-04-18 ENCOUNTER — Ambulatory Visit (INDEPENDENT_AMBULATORY_CARE_PROVIDER_SITE_OTHER): Payer: Medicare Other | Admitting: Cardiovascular Disease

## 2013-04-18 ENCOUNTER — Encounter: Payer: Self-pay | Admitting: Cardiovascular Disease

## 2013-04-18 VITALS — BP 98/64 | HR 109 | Ht 69.0 in | Wt 139.5 lb

## 2013-04-18 DIAGNOSIS — N184 Chronic kidney disease, stage 4 (severe): Secondary | ICD-10-CM

## 2013-04-18 DIAGNOSIS — R0602 Shortness of breath: Secondary | ICD-10-CM

## 2013-04-18 DIAGNOSIS — R000 Tachycardia, unspecified: Secondary | ICD-10-CM

## 2013-04-18 DIAGNOSIS — I5043 Acute on chronic combined systolic (congestive) and diastolic (congestive) heart failure: Secondary | ICD-10-CM

## 2013-04-18 DIAGNOSIS — I509 Heart failure, unspecified: Secondary | ICD-10-CM

## 2013-04-18 NOTE — Assessment & Plan Note (Signed)
Continues to have baseline tachycardia likely from poor cardiac output. This has been a chronic issue for him. No medication changes made. We'll continue low-dose carvedilol twice a day

## 2013-04-18 NOTE — Patient Instructions (Signed)
You are doing well. No medication changes were made.  Please call us if you have new issues that need to be addressed before your next appt.  Your physician wants you to follow-up in: 6 weeks 

## 2013-04-18 NOTE — Assessment & Plan Note (Signed)
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 Worsening renal function over the past 6 years. We did discuss that in the future he might need dialysis.

## 2013-04-18 NOTE — Progress Notes (Signed)
Patient ID: Isaac Collins, male    DOB: [birth year], 73 y.o.   MRN: [id number]  HPI Comments: Isaac Collins is a very pleasant 73 year old gentleman with a history of nonischemic cardiomyopathy, history of severe congestive heart failure EF <25% who was placed on hospice in 2008 who had been doing very well since that time on aggressive medical management and diuresis.  At the end of January 2013 he had weight gain, lower extremity edema, increasing shortness of breath,improved with aggressive diuresis.  He presents today for routine followup.   In followup today, There is always confusion with which medications he is taking as he seems unfamiliar with these and his niece does most of his medications.  sister recently had a stroke in August of 2014.  He was recently hospitalized for worsening weight gain, leg edema, abdominal swelling. He was evaluated in the hospital by cardiology, renal service. He had aggressive diuresis with several pound weight loss and felt better was felt that his weight was possibly 16 pounds heavier than his baseline. Creatinine on 03/29/2013 was 3.04, BUN 64. He was discharged on torsemide 100 mg twice a day In followup today he continues to have leg edema, particularly noticeable to him behind his knees bilaterally Ultrasound of the kidneys in the hospital was negative for hydronephrosis Creatinine on discharge 04/01/2013 was 2.91, BUN 56 He continues to have shortness of breath with heavy exertion, and has to rest  EKG today shows normal sinus rhythm with rate 105 beats per minute with poor R-wave progression through the anterior precordial leads, left axis deviation  Lab work over the past year has shown renal failure with elevated BUN. He did not followup with the renal physician as we suggested.  Echocardiogram done May 2014 showing severely depressed ejection fraction, normal right ventricular systolic pressures, ejection fraction 20-25%  Outpatient Encounter  Prescriptions as of 04/18/2013  Medication Sig  . allopurinol (ZYLOPRIM) 100 MG tablet Take 100 mg by mouth daily.  . carvedilol (COREG) 6.25 MG tablet Take 6.25 mg by mouth 2 (two) times daily with a meal.  . digoxin (LANOXIN) 0.125 MG tablet Take 0.125 mg by mouth daily.   . hydrocortisone 2.5 % cream Apply 1 application topically 2 (two) times daily.   . isosorbide mononitrate (IMDUR) 30 MG 24 hr tablet Take 30 mg by mouth daily.   Marland Kitchen. ketoconazole (NIZORAL) 2 % cream Apply 1 application topically 2 (two) times daily.   Marland Kitchen. torsemide (DEMADEX) 100 MG tablet Take 100 mg by mouth daily.    Review of Systems  Constitutional: Negative.   HENT: Negative.   Eyes: Negative.   Respiratory: Positive for shortness of breath.   Cardiovascular: Positive for leg swelling.  Gastrointestinal: Negative.   Endocrine: Negative.   Musculoskeletal: Negative.   Skin: Negative.   Allergic/Immunologic: Negative.   Neurological: Negative.   Hematological: Negative.   Psychiatric/Behavioral: Negative.   All other systems reviewed and are negative.    BP 98/64  Pulse 109  Ht 5\' 9"  (1.753 m)  Wt 139 lb 8 oz (63.277 kg)  BMI 20.59 kg/m2  Physical Exam  Nursing note and vitals reviewed. Constitutional: He is oriented to person, place, and time. He appears well-developed and well-nourished.  HENT:  Head: Normocephalic.  Nose: Nose normal.  Mouth/Throat: Oropharynx is clear and moist.  Eyes: Conjunctivae are normal. Pupils are equal, round, and reactive to light.  Neck: Normal range of motion. Neck supple. No JVD present.  Cardiovascular: Normal rate, regular rhythm, S1  normal, S2 normal and intact distal pulses.  Exam reveals no gallop and no friction rub.   Murmur heard.  Crescendo systolic murmur is present with a grade of 2/6  Pulmonary/Chest: Effort normal and breath sounds normal. No respiratory distress. He has no wheezes. He has no rales. He exhibits no tenderness.  Abdominal: Soft. Bowel  sounds are normal. He exhibits no distension. There is no tenderness.  Musculoskeletal: Normal range of motion. He exhibits no edema and no tenderness.  Lymphadenopathy:    He has no cervical adenopathy.  Neurological: He is alert and oriented to person, place, and time. Coordination normal.  Skin: Skin is warm and dry. No rash noted. No erythema.  Psychiatric: He has a normal mood and affect. His behavior is normal. Judgment and thought content normal.      Assessment and Plan

## 2013-04-18 NOTE — Assessment & Plan Note (Signed)
Appears to be relatively stable on torsemide 100 mg twice a day. Still with leg edema. Family presents with him today and reports he has had several pound weight loss since he arrived home. Encouraged him to minimize his fluid intake, continue his current medication regimen.

## 2013-05-02 ENCOUNTER — Ambulatory Visit: Payer: Medicare Other | Admitting: Cardiovascular Disease

## 2013-05-05 NOTE — Telephone Encounter (Signed)
This encounter was created in error - please disregard.

## 2013-05-15 ENCOUNTER — Ambulatory Visit: Payer: Self-pay | Admitting: Nephrology

## 2013-06-03 ENCOUNTER — Ambulatory Visit: Payer: Medicare Other | Admitting: Cardiovascular Disease

## 2013-06-17 ENCOUNTER — Ambulatory Visit: Payer: Medicare Other | Admitting: Cardiovascular Disease

## 2013-06-20 ENCOUNTER — Ambulatory Visit (INDEPENDENT_AMBULATORY_CARE_PROVIDER_SITE_OTHER): Payer: Medicare Other | Admitting: Cardiovascular Disease

## 2013-06-20 ENCOUNTER — Inpatient Hospital Stay: Payer: Self-pay | Admitting: Internal Medicine

## 2013-06-20 ENCOUNTER — Encounter: Payer: Self-pay | Admitting: Cardiovascular Disease

## 2013-06-20 VITALS — BP 92/58 | HR 102 | Ht 69.0 in | Wt 155.8 lb

## 2013-06-20 DIAGNOSIS — I420 Dilated cardiomyopathy: Secondary | ICD-10-CM

## 2013-06-20 DIAGNOSIS — N184 Chronic kidney disease, stage 4 (severe): Secondary | ICD-10-CM

## 2013-06-20 DIAGNOSIS — I369 Nonrheumatic tricuspid valve disorder, unspecified: Secondary | ICD-10-CM

## 2013-06-20 DIAGNOSIS — I509 Heart failure, unspecified: Secondary | ICD-10-CM

## 2013-06-20 DIAGNOSIS — I5043 Acute on chronic combined systolic (congestive) and diastolic (congestive) heart failure: Secondary | ICD-10-CM

## 2013-06-20 DIAGNOSIS — I428 Other cardiomyopathies: Secondary | ICD-10-CM

## 2013-06-20 DIAGNOSIS — N5089 Other specified disorders of the male genital organs: Secondary | ICD-10-CM

## 2013-06-20 DIAGNOSIS — R0602 Shortness of breath: Secondary | ICD-10-CM

## 2013-06-20 DIAGNOSIS — R000 Tachycardia, unspecified: Secondary | ICD-10-CM

## 2013-06-20 LAB — CBC WITH DIFFERENTIAL/PLATELET
Basophil #: 0.1 10*3/uL (ref 0.0–0.1)
Basophil %: 1.1 %
EOS PCT: 0.2 %
Eosinophil #: 0 10*3/uL (ref 0.0–0.7)
HCT: 42.3 % (ref 40.0–52.0)
HGB: 13.2 g/dL (ref 13.0–18.0)
LYMPHS ABS: 0.9 10*3/uL — AB (ref 1.0–3.6)
Lymphocyte %: 16.4 %
MCH: 27 pg (ref 26.0–34.0)
MCHC: 31.1 g/dL — ABNORMAL LOW (ref 32.0–36.0)
MCV: 87 fL (ref 80–100)
Monocyte #: 0.6 x10 3/mm (ref 0.2–1.0)
Monocyte %: 11.1 %
NEUTROS ABS: 4.1 10*3/uL (ref 1.4–6.5)
Neutrophil %: 71.2 %
Platelet: 143 10*3/uL — ABNORMAL LOW (ref 150–440)
RBC: 4.88 10*6/uL (ref 4.40–5.90)
RDW: 19.2 % — ABNORMAL HIGH (ref 11.5–14.5)
WBC: 5.7 10*3/uL (ref 3.8–10.6)

## 2013-06-20 LAB — COMPREHENSIVE METABOLIC PANEL
ALBUMIN: 2.5 g/dL — AB (ref 3.4–5.0)
ALK PHOS: 147 U/L — AB
Anion Gap: 9 (ref 7–16)
BILIRUBIN TOTAL: 2.3 mg/dL — AB (ref 0.2–1.0)
BUN: 58 mg/dL — ABNORMAL HIGH (ref 7–18)
CHLORIDE: 108 mmol/L — AB (ref 98–107)
Calcium, Total: 8.3 mg/dL — ABNORMAL LOW (ref 8.5–10.1)
Co2: 22 mmol/L (ref 21–32)
Creatinine: 3.42 mg/dL — ABNORMAL HIGH (ref 0.60–1.30)
EGFR (African American): 20 — ABNORMAL LOW
EGFR (Non-African Amer.): 17 — ABNORMAL LOW
Glucose: 148 mg/dL — ABNORMAL HIGH (ref 65–99)
OSMOLALITY: 296 (ref 275–301)
Potassium: 3.5 mmol/L (ref 3.5–5.1)
SGOT(AST): 18 U/L (ref 15–37)
SGPT (ALT): 14 U/L (ref 12–78)
Sodium: 139 mmol/L (ref 136–145)
Total Protein: 6.5 g/dL (ref 6.4–8.2)

## 2013-06-20 LAB — TROPONIN I
Troponin-I: <0.02 ng/mL
Troponin-I: <0.02 ng/mL

## 2013-06-20 LAB — DIGOXIN LEVEL: DIGOXIN: 0.44 ng/mL

## 2013-06-20 LAB — MAGNESIUM: Magnesium: 2.1 mg/dL

## 2013-06-20 LAB — PRO B NATRIURETIC PEPTIDE: B-TYPE NATIURETIC PEPTID: 26090 pg/mL — AB (ref 0–125)

## 2013-06-20 LAB — CK-MB: CK-MB: 2.6 ng/mL (ref 0.5–3.6)

## 2013-06-20 LAB — CK: CK, TOTAL: 113 U/L

## 2013-06-20 LAB — TSH: THYROID STIMULATING HORM: 4.11 u[IU]/mL

## 2013-06-20 NOTE — Assessment & Plan Note (Signed)
Followed by Dr. Cherylann Ratel. We'll contact Dr. Cherylann Ratel for  assistance while in the hospital.

## 2013-06-20 NOTE — Assessment & Plan Note (Signed)
Severely depressed ejection fraction. Likely worse with recent hospital visits now in the setting of renal failure

## 2013-06-20 NOTE — Assessment & Plan Note (Signed)
Worsening heart failure on today's visit. Significant shortness of breath, leg and groin swelling, abdominal swelling. As he is in moderate distress, very high weight for his baseline, we will admit him to the hospital for IV inotrope enhanced diuresis. Recommended restart dopamine 5 mg IV infusion with Lasix every 6 for drip/confusion

## 2013-06-20 NOTE — Assessment & Plan Note (Signed)
I believe he has a hydrocele. Symptoms severely exacerbated by heart failure on today's visit

## 2013-06-20 NOTE — Progress Notes (Signed)
Patient ID: Isaac Collins, male    DOB: [birth year], 73 y.o.   MRN: [id number]  HPI Comments: Mr. Isaac Collins is a very pleasant 73 year old gentleman with a history of nonischemic cardiomyopathy, history of severe congestive heart failure EF <25% who was placed on hospice in 2008 who had been doing very well since that time on aggressive medical management and diuresis (came off hospice).  At the end of January 2013 he had weight gain, lower extremity edema, increasing shortness of breath,improved with aggressive diuresis. Improvement with diuresis Recent hospitalization January 2015 for acute on chronic systolic CHF. Treated with Lasix IV with improvement of symptoms discharged on torsemide 100 mg twice a day. He presents today for routine followup.  On followup today, he reports taking torsemide 100 mg daily, not twice a day. Uncertain why he decrease the dose. Family is with him today and they were under the impression he was taking torsemide 100 mg twice a day. He reports having significant weight gain, groin swelling, worsening shortness of breath. Previously admitted to the hospital in January of this year with weight in the 147 range. Weight today is 155. Baseline weight is in the low 130 range. Worsening renal function over the past year, especially 6 months. Typically there is medication confusion when he does it and family is needed to manage his medications.  niece does most of his medications.  sister recently had a stroke in August of 2014. Recent loss in the family, nieces mother. Very stressful. As this was 2 weeks ago.  He does report having worsening leg edema, abdominal swelling.   EKG today shows normal sinus rhythm with rate 102 beats per minute with poor R-wave progression through the anterior precordial leads, left axis deviation  Echocardiogram done May 2014 showing severely depressed ejection fraction, normal right ventricular systolic pressures, ejection fraction  20-25%  Outpatient Encounter Prescriptions as of 06/20/2013  Medication Sig  . allopurinol (ZYLOPRIM) 100 MG tablet Take 100 mg by mouth daily.  . carvedilol (COREG) 6.25 MG tablet Take 6.25 mg by mouth 2 (two) times daily with a meal.  . digoxin (LANOXIN) 0.125 MG tablet Take 0.125 mg by mouth daily.   . hydrocortisone 2.5 % cream Apply 1 application topically 2 (two) times daily.   . isosorbide mononitrate (IMDUR) 30 MG 24 hr tablet Take 30 mg by mouth daily.   Marland Kitchen. ketoconazole (NIZORAL) 2 % cream Apply 1 application topically 2 (two) times daily.   Marland Kitchen. torsemide (DEMADEX) 100 MG tablet Take 100 mg by mouth  daily.  He was supposed to take this twice a day      Review of Systems  Constitutional: Negative.   HENT: Negative.   Eyes: Negative.   Respiratory: Positive for shortness of breath.   Cardiovascular: Positive for leg swelling.  Gastrointestinal: Positive for abdominal distention.  Endocrine: Negative.   Genitourinary: Positive for scrotal swelling.  Musculoskeletal: Negative.   Skin: Negative.   Allergic/Immunologic: Negative.   Neurological: Negative.   Hematological: Negative.   Psychiatric/Behavioral: Negative.   All other systems reviewed and are negative.   BP 92/58  Pulse 102  Ht 5\' 9"  (1.753 m)  Wt 155 lb 12 oz (70.648 kg)  BMI 22.99 kg/m2  Physical Exam  Nursing note and vitals reviewed. Constitutional: He is oriented to person, place, and time. He appears well-developed and well-nourished.  HENT:  Head: Normocephalic.  Nose: Nose normal.  Mouth/Throat: Oropharynx is clear and moist.  Eyes: Conjunctivae are normal. Pupils are equal,  round, and reactive to light.  Neck: Normal range of motion. Neck supple. No JVD present.  Cardiovascular: Normal rate, regular rhythm, S1 normal, S2 normal and intact distal pulses.  Exam reveals no gallop and no friction rub.   Murmur heard.  Crescendo systolic murmur is present with a grade of 2/6  Pulmonary/Chest: Effort  normal and breath sounds normal. No respiratory distress. He has no wheezes. He has no rales. He exhibits no tenderness.  Abdominal: Soft. Bowel sounds are normal. He exhibits no distension. There is no tenderness.  Musculoskeletal: Normal range of motion. He exhibits no edema and no tenderness.  Lymphadenopathy:    He has no cervical adenopathy.  Neurological: He is alert and oriented to person, place, and time. Coordination normal.  Skin: Skin is warm and dry. No rash noted. No erythema.  Psychiatric: He has a normal mood and affect. His behavior is normal. Judgment and thought content normal.      Assessment and Plan

## 2013-06-20 NOTE — Assessment & Plan Note (Signed)
Underlying tachycardia likely from severe heart failure.

## 2013-06-21 DIAGNOSIS — R000 Tachycardia, unspecified: Secondary | ICD-10-CM

## 2013-06-21 DIAGNOSIS — I5043 Acute on chronic combined systolic (congestive) and diastolic (congestive) heart failure: Secondary | ICD-10-CM

## 2013-06-21 DIAGNOSIS — I428 Other cardiomyopathies: Secondary | ICD-10-CM

## 2013-06-21 DIAGNOSIS — N189 Chronic kidney disease, unspecified: Secondary | ICD-10-CM

## 2013-06-21 LAB — LIPID PANEL
CHOLESTEROL: 82 mg/dL (ref 0–200)
HDL: 30 mg/dL — AB (ref 40–60)
LDL CHOLESTEROL, CALC: 42 mg/dL (ref 0–100)
TRIGLYCERIDES: 51 mg/dL (ref 0–200)
VLDL CHOLESTEROL, CALC: 10 mg/dL (ref 5–40)

## 2013-06-21 LAB — BASIC METABOLIC PANEL
ANION GAP: 7 (ref 7–16)
BUN: 58 mg/dL — ABNORMAL HIGH (ref 7–18)
Calcium, Total: 7.9 mg/dL — ABNORMAL LOW (ref 8.5–10.1)
Chloride: 109 mmol/L — ABNORMAL HIGH (ref 98–107)
Co2: 24 mmol/L (ref 21–32)
Creatinine: 3.16 mg/dL — ABNORMAL HIGH (ref 0.60–1.30)
EGFR (African American): 22 — ABNORMAL LOW
EGFR (Non-African Amer.): 19 — ABNORMAL LOW
Glucose: 99 mg/dL (ref 65–99)
Osmolality: 296 (ref 275–301)
Potassium: 3.6 mmol/L (ref 3.5–5.1)
SODIUM: 140 mmol/L (ref 136–145)

## 2013-06-21 LAB — CK: CK, Total: 92 U/L

## 2013-06-21 LAB — PRO B NATRIURETIC PEPTIDE: B-TYPE NATIURETIC PEPTID: 34081 pg/mL — AB (ref 0–125)

## 2013-06-21 LAB — MAGNESIUM: MAGNESIUM: 1.8 mg/dL

## 2013-06-22 LAB — CBC WITH DIFFERENTIAL/PLATELET
BASOS PCT: 0.5 %
Basophil #: 0 10*3/uL (ref 0.0–0.1)
Eosinophil #: 0 10*3/uL (ref 0.0–0.7)
Eosinophil %: 0.8 %
HCT: 40.6 % (ref 40.0–52.0)
HGB: 13.1 g/dL (ref 13.0–18.0)
Lymphocyte #: 1 10*3/uL (ref 1.0–3.6)
Lymphocyte %: 17.6 %
MCH: 27.4 pg (ref 26.0–34.0)
MCHC: 32.2 g/dL (ref 32.0–36.0)
MCV: 85 fL (ref 80–100)
MONOS PCT: 12.4 %
Monocyte #: 0.7 x10 3/mm (ref 0.2–1.0)
NEUTROS PCT: 68.7 %
Neutrophil #: 4 10*3/uL (ref 1.4–6.5)
Platelet: 132 10*3/uL — ABNORMAL LOW (ref 150–440)
RBC: 4.77 10*6/uL (ref 4.40–5.90)
RDW: 19 % — ABNORMAL HIGH (ref 11.5–14.5)
WBC: 5.8 10*3/uL (ref 3.8–10.6)

## 2013-06-22 LAB — BASIC METABOLIC PANEL
ANION GAP: 6 — AB (ref 7–16)
BUN: 57 mg/dL — AB (ref 7–18)
CHLORIDE: 107 mmol/L (ref 98–107)
Calcium, Total: 8 mg/dL — ABNORMAL LOW (ref 8.5–10.1)
Co2: 25 mmol/L (ref 21–32)
Creatinine: 2.74 mg/dL — ABNORMAL HIGH (ref 0.60–1.30)
EGFR (Non-African Amer.): 22 — ABNORMAL LOW
GFR CALC AF AMER: 26 — AB
Glucose: 109 mg/dL — ABNORMAL HIGH (ref 65–99)
Osmolality: 292 (ref 275–301)
POTASSIUM: 3.7 mmol/L (ref 3.5–5.1)
Sodium: 138 mmol/L (ref 136–145)

## 2013-06-23 LAB — BASIC METABOLIC PANEL
ANION GAP: 8 (ref 7–16)
BUN: 57 mg/dL — ABNORMAL HIGH (ref 7–18)
CO2: 26 mmol/L (ref 21–32)
Calcium, Total: 7.9 mg/dL — ABNORMAL LOW (ref 8.5–10.1)
Chloride: 104 mmol/L (ref 98–107)
Creatinine: 2.63 mg/dL — ABNORMAL HIGH (ref 0.60–1.30)
EGFR (African American): 27 — ABNORMAL LOW
GFR CALC NON AF AMER: 24 — AB
Glucose: 100 mg/dL — ABNORMAL HIGH (ref 65–99)
Osmolality: 292 (ref 275–301)
POTASSIUM: 3.8 mmol/L (ref 3.5–5.1)
Sodium: 138 mmol/L (ref 136–145)

## 2013-06-24 DIAGNOSIS — I428 Other cardiomyopathies: Secondary | ICD-10-CM

## 2013-06-24 DIAGNOSIS — I5043 Acute on chronic combined systolic (congestive) and diastolic (congestive) heart failure: Secondary | ICD-10-CM

## 2013-06-24 LAB — BASIC METABOLIC PANEL
Anion Gap: 5 — ABNORMAL LOW (ref 7–16)
BUN: 57 mg/dL — AB (ref 7–18)
CHLORIDE: 105 mmol/L (ref 98–107)
CO2: 26 mmol/L (ref 21–32)
Calcium, Total: 7.9 mg/dL — ABNORMAL LOW (ref 8.5–10.1)
Creatinine: 2.67 mg/dL — ABNORMAL HIGH (ref 0.60–1.30)
EGFR (African American): 27 — ABNORMAL LOW
EGFR (Non-African Amer.): 23 — ABNORMAL LOW
GLUCOSE: 88 mg/dL (ref 65–99)
OSMOLALITY: 287 (ref 275–301)
Potassium: 4.4 mmol/L (ref 3.5–5.1)
Sodium: 136 mmol/L (ref 136–145)

## 2013-06-24 LAB — CBC WITH DIFFERENTIAL/PLATELET
BASOS ABS: 0 10*3/uL (ref 0.0–0.1)
BASOS PCT: 0.9 %
EOS ABS: 0.1 10*3/uL (ref 0.0–0.7)
EOS PCT: 2 %
HCT: 41 % (ref 40.0–52.0)
HGB: 13 g/dL (ref 13.0–18.0)
LYMPHS ABS: 1.1 10*3/uL (ref 1.0–3.6)
Lymphocyte %: 21.3 %
MCH: 27.3 pg (ref 26.0–34.0)
MCHC: 31.6 g/dL — AB (ref 32.0–36.0)
MCV: 87 fL (ref 80–100)
MONO ABS: 0.6 x10 3/mm (ref 0.2–1.0)
Monocyte %: 11 %
NEUTROS ABS: 3.3 10*3/uL (ref 1.4–6.5)
NEUTROS PCT: 64.8 %
Platelet: 134 10*3/uL — ABNORMAL LOW (ref 150–440)
RBC: 4.75 10*6/uL (ref 4.40–5.90)
RDW: 19.6 % — ABNORMAL HIGH (ref 11.5–14.5)
WBC: 5.1 10*3/uL (ref 3.8–10.6)

## 2013-06-25 LAB — CBC WITH DIFFERENTIAL/PLATELET
BASOS PCT: 0.8 %
Basophil #: 0 10*3/uL (ref 0.0–0.1)
Eosinophil #: 0.1 10*3/uL (ref 0.0–0.7)
Eosinophil %: 1.5 %
HCT: 38.7 % — ABNORMAL LOW (ref 40.0–52.0)
HGB: 12.4 g/dL — ABNORMAL LOW (ref 13.0–18.0)
LYMPHS PCT: 20.9 %
Lymphocyte #: 1.2 10*3/uL (ref 1.0–3.6)
MCH: 27.7 pg (ref 26.0–34.0)
MCHC: 32 g/dL (ref 32.0–36.0)
MCV: 87 fL (ref 80–100)
Monocyte #: 0.7 x10 3/mm (ref 0.2–1.0)
Monocyte %: 12.5 %
NEUTROS ABS: 3.6 10*3/uL (ref 1.4–6.5)
NEUTROS PCT: 64.3 %
Platelet: 146 10*3/uL — ABNORMAL LOW (ref 150–440)
RBC: 4.47 10*6/uL (ref 4.40–5.90)
RDW: 19.3 % — ABNORMAL HIGH (ref 11.5–14.5)
WBC: 5.5 10*3/uL (ref 3.8–10.6)

## 2013-06-25 LAB — BASIC METABOLIC PANEL
ANION GAP: 5 — AB (ref 7–16)
BUN: 60 mg/dL — ABNORMAL HIGH (ref 7–18)
CO2: 27 mmol/L (ref 21–32)
Calcium, Total: 8.1 mg/dL — ABNORMAL LOW (ref 8.5–10.1)
Chloride: 103 mmol/L (ref 98–107)
Creatinine: 2.64 mg/dL — ABNORMAL HIGH (ref 0.60–1.30)
EGFR (African American): 27 — ABNORMAL LOW
GFR CALC NON AF AMER: 23 — AB
Glucose: 98 mg/dL (ref 65–99)
Osmolality: 287 (ref 275–301)
Potassium: 4.3 mmol/L (ref 3.5–5.1)
SODIUM: 135 mmol/L — AB (ref 136–145)

## 2013-06-26 DIAGNOSIS — I498 Other specified cardiac arrhythmias: Secondary | ICD-10-CM

## 2013-06-26 DIAGNOSIS — I5043 Acute on chronic combined systolic (congestive) and diastolic (congestive) heart failure: Secondary | ICD-10-CM

## 2013-06-26 LAB — BASIC METABOLIC PANEL
Anion Gap: 6 — ABNORMAL LOW (ref 7–16)
BUN: 66 mg/dL — ABNORMAL HIGH (ref 7–18)
CALCIUM: 8.3 mg/dL — AB (ref 8.5–10.1)
CHLORIDE: 104 mmol/L (ref 98–107)
CREATININE: 3.01 mg/dL — AB (ref 0.60–1.30)
Co2: 27 mmol/L (ref 21–32)
EGFR (African American): 23 — ABNORMAL LOW
EGFR (Non-African Amer.): 20 — ABNORMAL LOW
GLUCOSE: 187 mg/dL — AB (ref 65–99)
Osmolality: 298 (ref 275–301)
Potassium: 4.1 mmol/L (ref 3.5–5.1)
Sodium: 137 mmol/L (ref 136–145)

## 2013-06-26 LAB — CBC WITH DIFFERENTIAL/PLATELET
Basophil #: 0 10*3/uL (ref 0.0–0.1)
Basophil %: 0.8 %
EOS ABS: 0.1 10*3/uL (ref 0.0–0.7)
Eosinophil %: 1 %
HCT: 37.6 % — ABNORMAL LOW (ref 40.0–52.0)
HGB: 11.9 g/dL — AB (ref 13.0–18.0)
Lymphocyte #: 0.9 10*3/uL — ABNORMAL LOW (ref 1.0–3.6)
Lymphocyte %: 16.2 %
MCH: 27.3 pg (ref 26.0–34.0)
MCHC: 31.7 g/dL — AB (ref 32.0–36.0)
MCV: 86 fL (ref 80–100)
MONOS PCT: 11.6 %
Monocyte #: 0.6 x10 3/mm (ref 0.2–1.0)
NEUTROS ABS: 3.8 10*3/uL (ref 1.4–6.5)
NEUTROS PCT: 70.4 %
PLATELETS: 133 10*3/uL — AB (ref 150–440)
RBC: 4.37 10*6/uL — ABNORMAL LOW (ref 4.40–5.90)
RDW: 19.4 % — AB (ref 11.5–14.5)
WBC: 5.3 10*3/uL (ref 3.8–10.6)

## 2013-06-27 DIAGNOSIS — I5023 Acute on chronic systolic (congestive) heart failure: Secondary | ICD-10-CM

## 2013-06-27 LAB — CBC WITH DIFFERENTIAL/PLATELET
Basophil #: 0.1 10*3/uL (ref 0.0–0.1)
Basophil %: 1.2 %
EOS ABS: 0.1 10*3/uL (ref 0.0–0.7)
Eosinophil %: 1.2 %
HCT: 37.2 % — AB (ref 40.0–52.0)
HGB: 12 g/dL — AB (ref 13.0–18.0)
LYMPHS PCT: 21.6 %
Lymphocyte #: 1.2 10*3/uL (ref 1.0–3.6)
MCH: 27.8 pg (ref 26.0–34.0)
MCHC: 32.4 g/dL (ref 32.0–36.0)
MCV: 86 fL (ref 80–100)
Monocyte #: 0.8 x10 3/mm (ref 0.2–1.0)
Monocyte %: 13.5 %
NEUTROS ABS: 3.5 10*3/uL (ref 1.4–6.5)
Neutrophil %: 62.5 %
Platelet: 144 10*3/uL — ABNORMAL LOW (ref 150–440)
RBC: 4.33 10*6/uL — ABNORMAL LOW (ref 4.40–5.90)
RDW: 19.4 % — ABNORMAL HIGH (ref 11.5–14.5)
WBC: 5.6 10*3/uL (ref 3.8–10.6)

## 2013-06-27 LAB — BASIC METABOLIC PANEL
ANION GAP: 7 (ref 7–16)
BUN: 69 mg/dL — ABNORMAL HIGH (ref 7–18)
CREATININE: 2.83 mg/dL — AB (ref 0.60–1.30)
Calcium, Total: 8.6 mg/dL (ref 8.5–10.1)
Chloride: 103 mmol/L (ref 98–107)
Co2: 26 mmol/L (ref 21–32)
EGFR (African American): 25 — ABNORMAL LOW
GFR CALC NON AF AMER: 22 — AB
GLUCOSE: 97 mg/dL (ref 65–99)
Osmolality: 292 (ref 275–301)
POTASSIUM: 4.2 mmol/L (ref 3.5–5.1)
Sodium: 136 mmol/L (ref 136–145)

## 2013-07-03 ENCOUNTER — Telehealth: Payer: Self-pay

## 2013-07-03 NOTE — Telephone Encounter (Signed)
Patient contacted regarding discharge [facility] on 06/27/13.  Patient understands to follow up with Corine Shelter, PA on 07/09/13 at 11:00 at [facility]. Patient understands discharge instructions? yes Patient understands medications and regiment? yes Patient understands to bring all medications to this visit? yes

## 2013-07-09 ENCOUNTER — Encounter: Payer: Medicare Other | Admitting: Cardiology

## 2013-07-11 ENCOUNTER — Ambulatory Visit: Payer: Self-pay | Admitting: Internal Medicine

## 2013-07-16 LAB — COMPREHENSIVE METABOLIC PANEL
ALBUMIN: 2.7 g/dL — AB (ref 3.4–5.0)
Alkaline Phosphatase: 163 U/L — ABNORMAL HIGH
Anion Gap: 10 (ref 7–16)
BILIRUBIN TOTAL: 3.1 mg/dL — AB (ref 0.2–1.0)
BUN: 100 mg/dL — ABNORMAL HIGH (ref 7–18)
CHLORIDE: 97 mmol/L — AB (ref 98–107)
Calcium, Total: 8.6 mg/dL (ref 8.5–10.1)
Co2: 21 mmol/L (ref 21–32)
Creatinine: 4.31 mg/dL — ABNORMAL HIGH (ref 0.60–1.30)
GFR CALC AF AMER: 15 — AB
GFR CALC NON AF AMER: 13 — AB
Glucose: 109 mg/dL — ABNORMAL HIGH (ref 65–99)
Osmolality: 289 (ref 275–301)
POTASSIUM: 6.4 mmol/L — AB (ref 3.5–5.1)
SGOT(AST): 67 U/L — ABNORMAL HIGH (ref 15–37)
SGPT (ALT): 34 U/L (ref 12–78)
Sodium: 128 mmol/L — ABNORMAL LOW (ref 136–145)
Total Protein: 7 g/dL (ref 6.4–8.2)

## 2013-07-16 LAB — PROTIME-INR
INR: 1.3
Prothrombin Time: 16.2 secs — ABNORMAL HIGH (ref 11.5–14.7)

## 2013-07-16 LAB — MAGNESIUM: MAGNESIUM: 2.4 mg/dL

## 2013-07-16 LAB — TROPONIN I: Troponin-I: 0.03 ng/mL

## 2013-07-16 LAB — PRO B NATRIURETIC PEPTIDE: B-Type Natriuretic Peptide: 30627 pg/mL — ABNORMAL HIGH (ref 0–125)

## 2013-07-16 LAB — PHOSPHORUS: Phosphorus: 5.2 mg/dL — ABNORMAL HIGH (ref 2.5–4.9)

## 2013-07-17 ENCOUNTER — Inpatient Hospital Stay: Payer: Self-pay | Admitting: Internal Medicine

## 2013-07-17 DIAGNOSIS — N179 Acute kidney failure, unspecified: Secondary | ICD-10-CM

## 2013-07-17 DIAGNOSIS — N189 Chronic kidney disease, unspecified: Secondary | ICD-10-CM

## 2013-07-17 DIAGNOSIS — I5023 Acute on chronic systolic (congestive) heart failure: Secondary | ICD-10-CM

## 2013-07-17 DIAGNOSIS — R570 Cardiogenic shock: Secondary | ICD-10-CM

## 2013-07-17 LAB — URINALYSIS, COMPLETE
BLOOD: NEGATIVE
Bilirubin,UR: NEGATIVE
Glucose,UR: NEGATIVE mg/dL (ref 0–75)
Ketone: NEGATIVE
Leukocyte Esterase: NEGATIVE
Nitrite: NEGATIVE
PROTEIN: NEGATIVE
Ph: 5 (ref 4.5–8.0)
RBC,UR: 8 /HPF (ref 0–5)
Specific Gravity: 1.016 (ref 1.003–1.030)
Squamous Epithelial: <1
WBC UR: 2 /HPF (ref 0–5)

## 2013-07-17 LAB — CBC WITH DIFFERENTIAL/PLATELET
Basophil #: 0 10*3/uL (ref 0.0–0.1)
Basophil #: 0.1 10*3/uL (ref 0.0–0.1)
Basophil %: 0.7 %
Basophil %: 0.9 %
EOS ABS: 0 10*3/uL (ref 0.0–0.7)
EOS PCT: 0.4 %
Eosinophil #: 0 10*3/uL (ref 0.0–0.7)
Eosinophil %: 0.2 %
HCT: 45.8 % (ref 40.0–52.0)
HCT: 47.3 % (ref 40.0–52.0)
HGB: 14.2 g/dL (ref 13.0–18.0)
HGB: 14.8 g/dL (ref 13.0–18.0)
LYMPHS PCT: 16.2 %
Lymphocyte #: 0.9 10*3/uL — ABNORMAL LOW (ref 1.0–3.6)
Lymphocyte #: 0.9 10*3/uL — ABNORMAL LOW (ref 1.0–3.6)
Lymphocyte %: 15.2 %
MCH: 26.2 pg (ref 26.0–34.0)
MCH: 26.8 pg (ref 26.0–34.0)
MCHC: 31 g/dL — AB (ref 32.0–36.0)
MCHC: 31.2 g/dL — ABNORMAL LOW (ref 32.0–36.0)
MCV: 85 fL (ref 80–100)
MCV: 86 fL (ref 80–100)
MONO ABS: 0.7 x10 3/mm (ref 0.2–1.0)
Monocyte #: 0.6 x10 3/mm (ref 0.2–1.0)
Monocyte %: 10 %
Monocyte %: 12.3 %
NEUTROS ABS: 4.2 10*3/uL (ref 1.4–6.5)
NEUTROS PCT: 72.7 %
Neutrophil #: 4.2 10*3/uL (ref 1.4–6.5)
Neutrophil %: 71.4 %
Platelet: 119 10*3/uL — ABNORMAL LOW (ref 150–440)
Platelet: 114 10*3/uL — ABNORMAL LOW (ref 150–440)
RBC: 5.42 10*6/uL (ref 4.40–5.90)
RBC: 5.5 10*6/uL (ref 4.40–5.90)
RDW: 19.3 % — ABNORMAL HIGH (ref 11.5–14.5)
RDW: 19.7 % — ABNORMAL HIGH (ref 11.5–14.5)
WBC: 5.8 10*3/uL (ref 3.8–10.6)
WBC: 5.9 10*3/uL (ref 3.8–10.6)

## 2013-07-17 LAB — BASIC METABOLIC PANEL
ANION GAP: 14 (ref 7–16)
BUN: 106 mg/dL — ABNORMAL HIGH (ref 7–18)
CALCIUM: 8.9 mg/dL (ref 8.5–10.1)
CHLORIDE: 102 mmol/L (ref 98–107)
CO2: 17 mmol/L — AB (ref 21–32)
CREATININE: 4.31 mg/dL — AB (ref 0.60–1.30)
EGFR (Non-African Amer.): 13 — ABNORMAL LOW
GFR CALC AF AMER: 15 — AB
GLUCOSE: 57 mg/dL — AB (ref 65–99)
OSMOLALITY: 297 (ref 275–301)
Potassium: 4.6 mmol/L (ref 3.5–5.1)
Sodium: 133 mmol/L — ABNORMAL LOW (ref 136–145)

## 2013-07-17 LAB — TROPONIN I
TROPONIN-I: 0.03 ng/mL
Troponin-I: 0.03 ng/mL

## 2013-07-18 DIAGNOSIS — N189 Chronic kidney disease, unspecified: Secondary | ICD-10-CM

## 2013-07-18 DIAGNOSIS — I5023 Acute on chronic systolic (congestive) heart failure: Secondary | ICD-10-CM

## 2013-07-18 DIAGNOSIS — N179 Acute kidney failure, unspecified: Secondary | ICD-10-CM

## 2013-07-18 LAB — CBC WITH DIFFERENTIAL/PLATELET
BASOS ABS: 0 10*3/uL (ref 0.0–0.1)
Basophil %: 0.4 %
Eosinophil #: 0 10*3/uL (ref 0.0–0.7)
Eosinophil %: 0 %
HCT: 41 % (ref 40.0–52.0)
HGB: 13 g/dL (ref 13.0–18.0)
Lymphocyte #: 0.4 10*3/uL — ABNORMAL LOW (ref 1.0–3.6)
Lymphocyte %: 5.8 %
MCH: 26.5 pg (ref 26.0–34.0)
MCHC: 31.8 g/dL — ABNORMAL LOW (ref 32.0–36.0)
MCV: 84 fL (ref 80–100)
Monocyte #: 0.4 x10 3/mm (ref 0.2–1.0)
Monocyte %: 6.1 %
NEUTROS PCT: 87.7 %
Neutrophil #: 6 10*3/uL (ref 1.4–6.5)
Platelet: 103 10*3/uL — ABNORMAL LOW (ref 150–440)
RBC: 4.9 10*6/uL (ref 4.40–5.90)
RDW: 18.8 % — AB (ref 11.5–14.5)
WBC: 6.9 10*3/uL (ref 3.8–10.6)

## 2013-07-18 LAB — BASIC METABOLIC PANEL
Anion Gap: 13 (ref 7–16)
BUN: 99 mg/dL — ABNORMAL HIGH (ref 7–18)
CHLORIDE: 100 mmol/L (ref 98–107)
CREATININE: 4.46 mg/dL — AB (ref 0.60–1.30)
Calcium, Total: 8.2 mg/dL — ABNORMAL LOW (ref 8.5–10.1)
Co2: 19 mmol/L — ABNORMAL LOW (ref 21–32)
EGFR (African American): 14 — ABNORMAL LOW
EGFR (Non-African Amer.): 12 — ABNORMAL LOW
Glucose: 172 mg/dL — ABNORMAL HIGH (ref 65–99)
OSMOLALITY: 299 (ref 275–301)
Potassium: 3.3 mmol/L — ABNORMAL LOW (ref 3.5–5.1)
SODIUM: 132 mmol/L — AB (ref 136–145)

## 2013-07-18 LAB — URINE CULTURE

## 2013-07-19 LAB — CBC WITH DIFFERENTIAL/PLATELET
BASOS PCT: 0.6 %
Basophil #: 0 10*3/uL (ref 0.0–0.1)
Eosinophil #: 0 10*3/uL (ref 0.0–0.7)
Eosinophil %: 0.2 %
HCT: 41.5 % (ref 40.0–52.0)
HGB: 13.1 g/dL (ref 13.0–18.0)
Lymphocyte #: 0.5 10*3/uL — ABNORMAL LOW (ref 1.0–3.6)
Lymphocyte %: 8 %
MCH: 26.4 pg (ref 26.0–34.0)
MCHC: 31.6 g/dL — ABNORMAL LOW (ref 32.0–36.0)
MCV: 83 fL (ref 80–100)
Monocyte #: 0.5 x10 3/mm (ref 0.2–1.0)
Monocyte %: 8.4 %
NEUTROS ABS: 4.9 10*3/uL (ref 1.4–6.5)
Neutrophil %: 82.8 %
Platelet: 81 10*3/uL — ABNORMAL LOW (ref 150–440)
RBC: 4.97 10*6/uL (ref 4.40–5.90)
RDW: 18.8 % — ABNORMAL HIGH (ref 11.5–14.5)
WBC: 5.9 10*3/uL (ref 3.8–10.6)

## 2013-07-19 LAB — BASIC METABOLIC PANEL
Anion Gap: 16 (ref 7–16)
BUN: 105 mg/dL — AB (ref 7–18)
CHLORIDE: 99 mmol/L (ref 98–107)
Calcium, Total: 8 mg/dL — ABNORMAL LOW (ref 8.5–10.1)
Co2: 17 mmol/L — ABNORMAL LOW (ref 21–32)
Creatinine: 4.18 mg/dL — ABNORMAL HIGH (ref 0.60–1.30)
EGFR (Non-African Amer.): 13 — ABNORMAL LOW
GFR CALC AF AMER: 16 — AB
GLUCOSE: 123 mg/dL — AB (ref 65–99)
Osmolality: 299 (ref 275–301)
POTASSIUM: 4.1 mmol/L (ref 3.5–5.1)
Sodium: 132 mmol/L — ABNORMAL LOW (ref 136–145)

## 2013-07-20 LAB — BASIC METABOLIC PANEL
ANION GAP: 13 (ref 7–16)
BUN: 110 mg/dL — ABNORMAL HIGH (ref 7–18)
CREATININE: 4.25 mg/dL — AB (ref 0.60–1.30)
Calcium, Total: 8.2 mg/dL — ABNORMAL LOW (ref 8.5–10.1)
Chloride: 96 mmol/L — ABNORMAL LOW (ref 98–107)
Co2: 21 mmol/L (ref 21–32)
EGFR (Non-African Amer.): 13 — ABNORMAL LOW
GFR CALC AF AMER: 15 — AB
GLUCOSE: 121 mg/dL — AB (ref 65–99)
Osmolality: 297 (ref 275–301)
Potassium: 4 mmol/L (ref 3.5–5.1)
Sodium: 130 mmol/L — ABNORMAL LOW (ref 136–145)

## 2013-07-20 LAB — PLATELET COUNT: Platelet: 88 10*3/uL — ABNORMAL LOW (ref 150–440)

## 2013-07-21 ENCOUNTER — Ambulatory Visit (HOSPITAL_COMMUNITY)
Admission: AD | Admit: 2013-07-21 | Discharge: 2013-07-21 | Disposition: A | Discharge status: Home / Self Care | Payer: Medicare Other | Source: Other Acute Inpatient Hospital | Attending: Internal Medicine | Admitting: Internal Medicine

## 2013-07-21 ENCOUNTER — Inpatient Hospital Stay
Admission: AD | Admit: 2013-07-21 | Discharge: 2013-08-19 | Disposition: A | Discharge status: Hospice | Payer: Self-pay | Source: Ambulatory Visit | Attending: Internal Medicine | Admitting: Internal Medicine

## 2013-07-21 DIAGNOSIS — I5043 Acute on chronic combined systolic (congestive) and diastolic (congestive) heart failure: Secondary | ICD-10-CM

## 2013-07-21 DIAGNOSIS — I509 Heart failure, unspecified: Secondary | ICD-10-CM | POA: Insufficient documentation

## 2013-07-21 DIAGNOSIS — I5042 Chronic combined systolic (congestive) and diastolic (congestive) heart failure: Secondary | ICD-10-CM

## 2013-07-21 LAB — CBC WITH DIFFERENTIAL/PLATELET
Basophil #: 0 10*3/uL (ref 0.0–0.1)
Basophil %: 0.3 %
Eosinophil #: 0 10*3/uL (ref 0.0–0.7)
Eosinophil %: 0.1 %
HCT: 39.8 % — AB (ref 40.0–52.0)
HGB: 12.9 g/dL — ABNORMAL LOW (ref 13.0–18.0)
LYMPHS PCT: 5.6 %
Lymphocyte #: 0.5 10*3/uL — ABNORMAL LOW (ref 1.0–3.6)
MCH: 26.8 pg (ref 26.0–34.0)
MCHC: 32.4 g/dL (ref 32.0–36.0)
MCV: 83 fL (ref 80–100)
MONOS PCT: 7.9 %
Monocyte #: 0.7 x10 3/mm (ref 0.2–1.0)
NEUTROS ABS: 7.4 10*3/uL — AB (ref 1.4–6.5)
Neutrophil %: 86.1 %
Platelet: 86 10*3/uL — ABNORMAL LOW (ref 150–440)
RBC: 4.81 10*6/uL (ref 4.40–5.90)
RDW: 19.3 % — ABNORMAL HIGH (ref 11.5–14.5)
WBC: 8.6 10*3/uL (ref 3.8–10.6)

## 2013-07-21 LAB — CULTURE, BLOOD (SINGLE)

## 2013-07-21 LAB — BASIC METABOLIC PANEL
ANION GAP: 13 (ref 7–16)
BUN: 111 mg/dL — ABNORMAL HIGH (ref 7–18)
CHLORIDE: 94 mmol/L — AB (ref 98–107)
CREATININE: 4.29 mg/dL — AB (ref 0.60–1.30)
Calcium, Total: 8.2 mg/dL — ABNORMAL LOW (ref 8.5–10.1)
Co2: 21 mmol/L (ref 21–32)
EGFR (African American): 15 — ABNORMAL LOW
EGFR (Non-African Amer.): 13 — ABNORMAL LOW
Glucose: 121 mg/dL — ABNORMAL HIGH (ref 65–99)
OSMOLALITY: 293 (ref 275–301)
Potassium: 4.3 mmol/L (ref 3.5–5.1)
Sodium: 128 mmol/L — ABNORMAL LOW (ref 136–145)

## 2013-07-22 ENCOUNTER — Other Ambulatory Visit (HOSPITAL_COMMUNITY): Payer: Self-pay

## 2013-07-22 DIAGNOSIS — I5043 Acute on chronic combined systolic (congestive) and diastolic (congestive) heart failure: Secondary | ICD-10-CM

## 2013-07-22 DIAGNOSIS — I509 Heart failure, unspecified: Secondary | ICD-10-CM

## 2013-07-22 LAB — COMPREHENSIVE METABOLIC PANEL
ALBUMIN: 2.5 g/dL — AB (ref 3.5–5.2)
ALT: 18 U/L (ref 0–53)
AST: 18 U/L (ref 0–37)
Alkaline Phosphatase: 104 U/L (ref 39–117)
BUN: 114 mg/dL — AB (ref 6–23)
CALCIUM: 8 mg/dL — AB (ref 8.4–10.5)
CO2: 16 mEq/L — ABNORMAL LOW (ref 19–32)
Chloride: 89 mEq/L — ABNORMAL LOW (ref 96–112)
Creatinine, Ser: 4.11 mg/dL — ABNORMAL HIGH (ref 0.50–1.35)
GFR calc non Af Amer: 13 mL/min — ABNORMAL LOW (ref >90)
GFR, EST AFRICAN AMERICAN: 16 mL/min — AB (ref >90)
GLUCOSE: 100 mg/dL — AB (ref 70–99)
POTASSIUM: 4.4 meq/L (ref 3.7–5.3)
SODIUM: 130 meq/L — AB (ref 137–147)
TOTAL PROTEIN: 6 g/dL (ref 6.0–8.3)
Total Bilirubin: 3.4 mg/dL — ABNORMAL HIGH (ref 0.3–1.2)

## 2013-07-22 LAB — HEMOGLOBIN A1C
Hgb A1c MFr Bld: 5.7 % — ABNORMAL HIGH (ref <5.7)
Mean Plasma Glucose: 117 mg/dL — ABNORMAL HIGH (ref <117)

## 2013-07-22 LAB — LIPID PANEL
CHOL/HDL RATIO: 3.2 ratio
Cholesterol: 81 mg/dL (ref 0–200)
HDL: 25 mg/dL — AB (ref >39)
LDL Cholesterol: 41 mg/dL (ref 0–99)
TRIGLYCERIDES: 75 mg/dL (ref <150)
VLDL: 15 mg/dL (ref 0–40)

## 2013-07-22 LAB — MAGNESIUM: MAGNESIUM: 2.2 mg/dL (ref 1.5–2.5)

## 2013-07-22 LAB — CBC WITH DIFFERENTIAL/PLATELET
BASOS ABS: 0 10*3/uL (ref 0.0–0.1)
BASOS PCT: 0 % (ref 0–1)
EOS ABS: 0 10*3/uL (ref 0.0–0.7)
Eosinophils Relative: 0 % (ref 0–5)
HCT: 35.4 % — ABNORMAL LOW (ref 39.0–52.0)
HEMOGLOBIN: 12.3 g/dL — AB (ref 13.0–17.0)
Lymphocytes Relative: 7 % — ABNORMAL LOW (ref 12–46)
Lymphs Abs: 0.5 10*3/uL — ABNORMAL LOW (ref 0.7–4.0)
MCH: 26.4 pg (ref 26.0–34.0)
MCHC: 34.7 g/dL (ref 30.0–36.0)
MCV: 76 fL — ABNORMAL LOW (ref 78.0–100.0)
MONO ABS: 0.8 10*3/uL (ref 0.1–1.0)
MONOS PCT: 11 % (ref 3–12)
NEUTROS ABS: 5.6 10*3/uL (ref 1.7–7.7)
NEUTROS PCT: 82 % — AB (ref 43–77)
Platelets: 87 10*3/uL — ABNORMAL LOW (ref 150–400)
RBC: 4.66 MIL/uL (ref 4.22–5.81)
RDW: 17.5 % — AB (ref 11.5–15.5)
WBC: 6.8 10*3/uL (ref 4.0–10.5)

## 2013-07-22 LAB — PROTIME-INR
INR: 1.52 — AB (ref 0.00–1.49)
PROTHROMBIN TIME: 17.9 s — AB (ref 11.6–15.2)

## 2013-07-22 LAB — CK TOTAL AND CKMB (NOT AT ARMC)
CK, MB: 5.3 ng/mL — ABNORMAL HIGH (ref 0.3–4.0)
Relative Index: 3.7 — ABNORMAL HIGH (ref 0.0–2.5)
Total CK: 145 U/L (ref 7–232)

## 2013-07-22 LAB — PROCALCITONIN: PROCALCITONIN: 4.08 ng/mL

## 2013-07-22 LAB — T4, FREE: Free T4: 0.97 ng/dL (ref 0.80–1.80)

## 2013-07-22 LAB — TSH: TSH: 3.51 u[IU]/mL (ref 0.350–4.500)

## 2013-07-22 LAB — APTT: APTT: 39 s — AB (ref 24–37)

## 2013-07-22 LAB — PREALBUMIN: Prealbumin: 6.1 mg/dL — ABNORMAL LOW (ref 17.0–34.0)

## 2013-07-22 LAB — PHOSPHORUS: Phosphorus: 6 mg/dL — ABNORMAL HIGH (ref 2.3–4.6)

## 2013-07-22 LAB — TROPONIN I: Troponin I: <0.3 ng/mL (ref <0.30)

## 2013-07-22 LAB — PRO B NATRIURETIC PEPTIDE: PRO B NATRI PEPTIDE: 29276 pg/mL — AB (ref 0–125)

## 2013-07-22 NOTE — Progress Notes (Signed)
    Subjective:  Denies CP; Mild dyspnea   Objective:  There were no vitals filed for this visit.  Intake/Output from previous day: No intake or output data in the 24 hours ending 07/22/13 1310  Physical Exam: Physical exam: Well-developed, Frail, chronically ill-appearing Skin is warm and dry.  HEENT is normal.  Neck is supple.  Chest Basilar crackles Cardiovascular exam is regular rate and rhythm. Positive gallop Abdominal exam nontender or distended. Abdominal wall edema; inguinal hernia Extremities show 1+ edema. Chronic skin changes neuro grossly intact    Lab Results: Basic Metabolic Panel:  Recent Labs  33/43/56 0530  NA 130*  K 4.4  CL 89*  CO2 16*  GLUCOSE 100*  BUN 114*  CREATININE 4.11*  CALCIUM 8.0*  MG 2.2  PHOS 6.0*   CBC:  Recent Labs  07/22/13 0530  WBC 6.8  NEUTROABS 5.6  HGB 12.3*  HCT 35.4*  MCV 76.0*  PLT 87*   Cardiac Enzymes:  Recent Labs  07/22/13 0530  CKTOTAL 145  CKMB 5.3*  TROPONINI <0.30     Assessment/Plan:  73 year old male with past medical history of nonischemic cardiomyopathy and renal insufficiency. Patient admitted to [facility] with CHF/cardiogenic shock and episodes of nonsustained ventricular tachycardia. Patient was treated with dobutamine, dopamine, IV diuretics and amiodarone. He was seen by palliative care and made a Limited code with no CPR or intubation. He was transferred to [facility] for further management. 1 nonischemic cardiomyopathy/acute on chronic systolic congestive heart failure/cardiogenic shock-the patient's systolic blood pressure is approximately 90. We cannot add an ACE inhibitor because of renal insufficiency and a beta blocker is contraindicated because of hypotension and cardiogenic shock. Continue dobutamine and Lasix drip. He remains markedly volume overloaded and has signs and symptoms of low output despite maximal medical therapy. His renal function is deteriorating.  His prognosis is extremely poor. He is a limited code and I would favor comfort measures. He should be evaluated by hospice/palliative care. 2 acute on chronic renal failure-the patient is most likely not a dialysis candidate. Follow renal function with diuresis. We will follow.  Lewayne Bunting 07/22/2013, 1:10 PM

## 2013-07-23 LAB — PHOSPHORUS: Phosphorus: 5.7 mg/dL — ABNORMAL HIGH (ref 2.3–4.6)

## 2013-07-23 LAB — CBC WITH DIFFERENTIAL/PLATELET
BASOS ABS: 0 10*3/uL (ref 0.0–0.1)
BASOS PCT: 0 % (ref 0–1)
EOS ABS: 0 10*3/uL (ref 0.0–0.7)
EOS PCT: 0 % (ref 0–5)
HCT: 34.2 % — ABNORMAL LOW (ref 39.0–52.0)
Hemoglobin: 12.1 g/dL — ABNORMAL LOW (ref 13.0–17.0)
LYMPHS ABS: 0.4 10*3/uL — AB (ref 0.7–4.0)
Lymphocytes Relative: 7 % — ABNORMAL LOW (ref 12–46)
MCH: 26.4 pg (ref 26.0–34.0)
MCHC: 35.4 g/dL (ref 30.0–36.0)
MCV: 74.5 fL — ABNORMAL LOW (ref 78.0–100.0)
Monocytes Absolute: 0.6 10*3/uL (ref 0.1–1.0)
Monocytes Relative: 10 % (ref 3–12)
Neutro Abs: 4.8 10*3/uL (ref 1.7–7.7)
Neutrophils Relative %: 83 % — ABNORMAL HIGH (ref 43–77)
PLATELETS: 88 10*3/uL — AB (ref 150–400)
RBC: 4.59 MIL/uL (ref 4.22–5.81)
RDW: 17.3 % — AB (ref 11.5–15.5)
WBC: 5.8 10*3/uL (ref 4.0–10.5)

## 2013-07-23 LAB — MAGNESIUM: Magnesium: 2.1 mg/dL (ref 1.5–2.5)

## 2013-07-23 LAB — BASIC METABOLIC PANEL
BUN: 116 mg/dL — ABNORMAL HIGH (ref 6–23)
CALCIUM: 8 mg/dL — AB (ref 8.4–10.5)
CO2: 18 mEq/L — ABNORMAL LOW (ref 19–32)
Chloride: 91 mEq/L — ABNORMAL LOW (ref 96–112)
Creatinine, Ser: 4.03 mg/dL — ABNORMAL HIGH (ref 0.50–1.35)
GFR calc Af Amer: 16 mL/min — ABNORMAL LOW (ref >90)
GFR, EST NON AFRICAN AMERICAN: 14 mL/min — AB (ref >90)
GLUCOSE: 116 mg/dL — AB (ref 70–99)
Potassium: 4.2 mEq/L (ref 3.7–5.3)
SODIUM: 130 meq/L — AB (ref 137–147)

## 2013-07-24 ENCOUNTER — Other Ambulatory Visit (HOSPITAL_COMMUNITY): Payer: Self-pay

## 2013-07-24 LAB — CBC
HEMATOCRIT: 35.5 % — AB (ref 39.0–52.0)
Hemoglobin: 12.3 g/dL — ABNORMAL LOW (ref 13.0–17.0)
MCH: 26.3 pg (ref 26.0–34.0)
MCHC: 34.6 g/dL (ref 30.0–36.0)
MCV: 76 fL — AB (ref 78.0–100.0)
Platelets: 77 10*3/uL — ABNORMAL LOW (ref 150–400)
RBC: 4.67 MIL/uL (ref 4.22–5.81)
RDW: 17.9 % — AB (ref 11.5–15.5)
WBC: 5.8 10*3/uL (ref 4.0–10.5)

## 2013-07-24 LAB — BASIC METABOLIC PANEL
BUN: 116 mg/dL — AB (ref 6–23)
CO2: 19 mEq/L (ref 19–32)
CREATININE: 4.04 mg/dL — AB (ref 0.50–1.35)
Calcium: 8.2 mg/dL — ABNORMAL LOW (ref 8.4–10.5)
Chloride: 91 mEq/L — ABNORMAL LOW (ref 96–112)
GFR calc non Af Amer: 14 mL/min — ABNORMAL LOW (ref >90)
GFR, EST AFRICAN AMERICAN: 16 mL/min — AB (ref >90)
Glucose, Bld: 93 mg/dL (ref 70–99)
POTASSIUM: 4.1 meq/L (ref 3.7–5.3)
Sodium: 132 mEq/L — ABNORMAL LOW (ref 137–147)

## 2013-07-25 LAB — CBC WITH DIFFERENTIAL/PLATELET
BASOS ABS: 0 10*3/uL (ref 0.0–0.1)
BASOS PCT: 0 % (ref 0–1)
Eosinophils Absolute: 0 10*3/uL (ref 0.0–0.7)
Eosinophils Relative: 0 % (ref 0–5)
HCT: 33.8 % — ABNORMAL LOW (ref 39.0–52.0)
Hemoglobin: 11.9 g/dL — ABNORMAL LOW (ref 13.0–17.0)
Lymphocytes Relative: 6 % — ABNORMAL LOW (ref 12–46)
Lymphs Abs: 0.4 10*3/uL — ABNORMAL LOW (ref 0.7–4.0)
MCH: 26.2 pg (ref 26.0–34.0)
MCHC: 35.2 g/dL (ref 30.0–36.0)
MCV: 74.4 fL — ABNORMAL LOW (ref 78.0–100.0)
Monocytes Absolute: 0.6 10*3/uL (ref 0.1–1.0)
Monocytes Relative: 8 % (ref 3–12)
NEUTROS ABS: 6.2 10*3/uL (ref 1.7–7.7)
NEUTROS PCT: 86 % — AB (ref 43–77)
Platelets: 84 10*3/uL — ABNORMAL LOW (ref 150–400)
RBC: 4.54 MIL/uL (ref 4.22–5.81)
RDW: 17.7 % — AB (ref 11.5–15.5)
WBC: 7.2 10*3/uL (ref 4.0–10.5)

## 2013-07-25 LAB — COMPREHENSIVE METABOLIC PANEL
ALT: 18 U/L (ref 0–53)
AST: 23 U/L (ref 0–37)
Albumin: 2.3 g/dL — ABNORMAL LOW (ref 3.5–5.2)
Alkaline Phosphatase: 92 U/L (ref 39–117)
BUN: 115 mg/dL — ABNORMAL HIGH (ref 6–23)
CALCIUM: 8.1 mg/dL — AB (ref 8.4–10.5)
CO2: 16 mEq/L — ABNORMAL LOW (ref 19–32)
Chloride: 96 mEq/L (ref 96–112)
Creatinine, Ser: 4.38 mg/dL — ABNORMAL HIGH (ref 0.50–1.35)
GFR calc non Af Amer: 12 mL/min — ABNORMAL LOW (ref >90)
GFR, EST AFRICAN AMERICAN: 14 mL/min — AB (ref >90)
Glucose, Bld: 144 mg/dL — ABNORMAL HIGH (ref 70–99)
Potassium: 4.4 mEq/L (ref 3.7–5.3)
SODIUM: 135 meq/L — AB (ref 137–147)
Total Bilirubin: 4.5 mg/dL — ABNORMAL HIGH (ref 0.3–1.2)
Total Protein: 5.9 g/dL — ABNORMAL LOW (ref 6.0–8.3)

## 2013-07-25 LAB — BASIC METABOLIC PANEL
BUN: 121 mg/dL — ABNORMAL HIGH (ref 6–23)
CHLORIDE: 91 meq/L — AB (ref 96–112)
CO2: 18 mEq/L — ABNORMAL LOW (ref 19–32)
Calcium: 8.1 mg/dL — ABNORMAL LOW (ref 8.4–10.5)
Creatinine, Ser: 4.08 mg/dL — ABNORMAL HIGH (ref 0.50–1.35)
GFR calc non Af Amer: 14 mL/min — ABNORMAL LOW (ref >90)
GFR, EST AFRICAN AMERICAN: 16 mL/min — AB (ref >90)
Glucose, Bld: 121 mg/dL — ABNORMAL HIGH (ref 70–99)
POTASSIUM: 4.1 meq/L (ref 3.7–5.3)
Sodium: 129 mEq/L — ABNORMAL LOW (ref 137–147)

## 2013-07-25 LAB — PHOSPHORUS: Phosphorus: 5.5 mg/dL — ABNORMAL HIGH (ref 2.3–4.6)

## 2013-07-25 LAB — MAGNESIUM: Magnesium: 2.1 mg/dL (ref 1.5–2.5)

## 2013-07-25 LAB — PRO B NATRIURETIC PEPTIDE: PRO B NATRI PEPTIDE: 33434 pg/mL — AB (ref 0–125)

## 2013-07-26 LAB — PHOSPHORUS: Phosphorus: 4.6 mg/dL (ref 2.3–4.6)

## 2013-07-26 LAB — CBC WITH DIFFERENTIAL/PLATELET
Basophils Absolute: 0 10*3/uL (ref 0.0–0.1)
Basophils Relative: 0 % (ref 0–1)
EOS ABS: 0 10*3/uL (ref 0.0–0.7)
Eosinophils Relative: 1 % (ref 0–5)
HCT: 33.1 % — ABNORMAL LOW (ref 39.0–52.0)
HEMOGLOBIN: 11.3 g/dL — AB (ref 13.0–17.0)
Lymphocytes Relative: 6 % — ABNORMAL LOW (ref 12–46)
Lymphs Abs: 0.4 10*3/uL — ABNORMAL LOW (ref 0.7–4.0)
MCH: 26.1 pg (ref 26.0–34.0)
MCHC: 34.1 g/dL (ref 30.0–36.0)
MCV: 76.4 fL — ABNORMAL LOW (ref 78.0–100.0)
MONOS PCT: 11 % (ref 3–12)
Monocytes Absolute: 0.7 10*3/uL (ref 0.1–1.0)
Neutro Abs: 5.1 10*3/uL (ref 1.7–7.7)
Neutrophils Relative %: 82 % — ABNORMAL HIGH (ref 43–77)
Platelets: 83 10*3/uL — ABNORMAL LOW (ref 150–400)
RBC: 4.33 MIL/uL (ref 4.22–5.81)
RDW: 17.9 % — ABNORMAL HIGH (ref 11.5–15.5)
WBC: 6.2 10*3/uL (ref 4.0–10.5)

## 2013-07-26 LAB — BASIC METABOLIC PANEL
BUN: 121 mg/dL — AB (ref 6–23)
CHLORIDE: 97 meq/L (ref 96–112)
CO2: 20 meq/L (ref 19–32)
Calcium: 7.9 mg/dL — ABNORMAL LOW (ref 8.4–10.5)
Creatinine, Ser: 4.3 mg/dL — ABNORMAL HIGH (ref 0.50–1.35)
GFR calc Af Amer: 15 mL/min — ABNORMAL LOW (ref >90)
GFR calc non Af Amer: 13 mL/min — ABNORMAL LOW (ref >90)
Glucose, Bld: 108 mg/dL — ABNORMAL HIGH (ref 70–99)
Potassium: 3.6 mEq/L — ABNORMAL LOW (ref 3.7–5.3)
Sodium: 136 mEq/L — ABNORMAL LOW (ref 137–147)

## 2013-07-26 LAB — DIGOXIN LEVEL: Digoxin Level: 0.4 ng/mL — ABNORMAL LOW (ref 0.8–2.0)

## 2013-07-26 LAB — MAGNESIUM: Magnesium: 2.1 mg/dL (ref 1.5–2.5)

## 2013-07-27 DIAGNOSIS — I5042 Chronic combined systolic (congestive) and diastolic (congestive) heart failure: Secondary | ICD-10-CM

## 2013-07-27 LAB — BASIC METABOLIC PANEL
BUN: 112 mg/dL — ABNORMAL HIGH (ref 6–23)
CHLORIDE: 97 meq/L (ref 96–112)
CO2: 22 meq/L (ref 19–32)
Calcium: 7.6 mg/dL — ABNORMAL LOW (ref 8.4–10.5)
Creatinine, Ser: 4.11 mg/dL — ABNORMAL HIGH (ref 0.50–1.35)
GFR calc Af Amer: 16 mL/min — ABNORMAL LOW (ref >90)
GFR calc non Af Amer: 13 mL/min — ABNORMAL LOW (ref >90)
GLUCOSE: 111 mg/dL — AB (ref 70–99)
POTASSIUM: 3.4 meq/L — AB (ref 3.7–5.3)
SODIUM: 136 meq/L — AB (ref 137–147)

## 2013-07-28 DIAGNOSIS — I509 Heart failure, unspecified: Secondary | ICD-10-CM

## 2013-07-28 LAB — COMPREHENSIVE METABOLIC PANEL
ALK PHOS: 88 U/L (ref 39–117)
ALT: 23 U/L (ref 0–53)
AST: 31 U/L (ref 0–37)
Albumin: 2.5 g/dL — ABNORMAL LOW (ref 3.5–5.2)
BILIRUBIN TOTAL: 4.2 mg/dL — AB (ref 0.3–1.2)
BUN: 113 mg/dL — ABNORMAL HIGH (ref 6–23)
CHLORIDE: 97 meq/L (ref 96–112)
CO2: 23 meq/L (ref 19–32)
CREATININE: 3.98 mg/dL — AB (ref 0.50–1.35)
Calcium: 7.5 mg/dL — ABNORMAL LOW (ref 8.4–10.5)
GFR calc Af Amer: 16 mL/min — ABNORMAL LOW (ref >90)
GFR, EST NON AFRICAN AMERICAN: 14 mL/min — AB (ref >90)
Glucose, Bld: 115 mg/dL — ABNORMAL HIGH (ref 70–99)
POTASSIUM: 3.5 meq/L — AB (ref 3.7–5.3)
Sodium: 138 mEq/L (ref 137–147)
Total Protein: 5.9 g/dL — ABNORMAL LOW (ref 6.0–8.3)

## 2013-07-28 LAB — CBC
HCT: 33 % — ABNORMAL LOW (ref 39.0–52.0)
Hemoglobin: 11.3 g/dL — ABNORMAL LOW (ref 13.0–17.0)
MCH: 26 pg (ref 26.0–34.0)
MCHC: 34.2 g/dL (ref 30.0–36.0)
MCV: 75.9 fL — AB (ref 78.0–100.0)
PLATELETS: 74 10*3/uL — AB (ref 150–400)
RBC: 4.35 MIL/uL (ref 4.22–5.81)
RDW: 18.3 % — ABNORMAL HIGH (ref 11.5–15.5)
WBC: 5.8 10*3/uL (ref 4.0–10.5)

## 2013-07-28 LAB — PREALBUMIN: Prealbumin: 8.3 mg/dL — ABNORMAL LOW (ref 17.0–34.0)

## 2013-07-28 NOTE — Progress Notes (Signed)
     Patient Name: Isaac Collins Date of Encounter: 07/28/2013    Patient Profile: 73 yo male w/ hx NICM, CKD. Admitted to [facility] w/ CHF, CGS, NSVT. He is a limited NCB, no CPR or intubation. Has been in [facility] since 05/11.  SUBJECTIVE: Denies chest pain or SOB   LABS: CBC: Recent Labs  07/26/13 0500 07/28/13 0500  WBC 6.2 5.8  NEUTROABS 5.1  --   HGB 11.3* 11.3*  HCT 33.1* 33.0*  MCV 76.4* 75.9*  PLT 83* 74*   Basic Metabolic Panel: Recent Labs  07/26/13 0500 07/27/13 0500 07/28/13 0500  NA 136* 136* 138  K 3.6* 3.4* 3.5*  CL 97 97 97  CO2 20 22 23   GLUCOSE 108* 111* 115*  BUN 121* 112* 113*  CREATININE 4.30* 4.11* 3.98*  CALCIUM 7.9* 7.6* 7.5*  MG 2.1  --   --   PHOS 4.6  --   --    Liver Function Tests: Recent Labs  07/28/13 0500  AST 31  ALT 23  ALKPHOS 88  BILITOT 4.2*  PROT 5.9*  ALBUMIN 2.5*   BNP:   Pro B Natriuretic peptide (BNP)  Date/Time Value Ref Range Status  07/25/2013  5:00 AM 33434.0* 0 - 125 pg/mL Final  07/22/2013  5:30 AM 29276.0* 0 - 125 pg/mL Final   Radiology/Studies: No results found.   Current Medications:  See paper chart   ASSESSMENT AND PLAN: CHF,end stage - see paper chart, pt continues on Lasix and milrinone gtts  Signed, Darrol Jump , PA-C 5:50 PM 07/28/2013

## 2013-07-29 ENCOUNTER — Other Ambulatory Visit: Payer: Self-pay | Admitting: *Deleted

## 2013-07-29 LAB — BASIC METABOLIC PANEL
BUN: 118 mg/dL — ABNORMAL HIGH (ref 6–23)
CALCIUM: 7.4 mg/dL — AB (ref 8.4–10.5)
CO2: 24 mEq/L (ref 19–32)
CREATININE: 3.93 mg/dL — AB (ref 0.50–1.35)
Chloride: 97 mEq/L (ref 96–112)
GFR calc Af Amer: 16 mL/min — ABNORMAL LOW (ref >90)
GFR, EST NON AFRICAN AMERICAN: 14 mL/min — AB (ref >90)
Glucose, Bld: 131 mg/dL — ABNORMAL HIGH (ref 70–99)
Potassium: 3.9 mEq/L (ref 3.7–5.3)
Sodium: 136 mEq/L — ABNORMAL LOW (ref 137–147)

## 2013-07-30 ENCOUNTER — Other Ambulatory Visit: Payer: Self-pay | Admitting: *Deleted

## 2013-07-30 MED ORDER — HYDRALAZINE HCL 10 MG PO TABS: 10.0000 mg | ORAL_TABLET | Freq: Two times a day (BID) | ORAL | Status: DC

## 2013-07-30 MED ORDER — HYDRALAZINE HCL 10 MG PO TABS: 10.0000 mg | ORAL_TABLET | Freq: Two times a day (BID) | ORAL | Status: AC

## 2013-07-30 NOTE — Telephone Encounter (Signed)
Requested Prescriptions   Signed Prescriptions Disp Refills  . hydrALAZINE (APRESOLINE) 10 MG tablet 60 tablet 3    Sig: Take 1 tablet (10 mg total) by mouth 2 (two) times daily.    Authorizing Provider: Antonieta Iba    Ordering User: Kendrick Fries

## 2013-07-31 ENCOUNTER — Other Ambulatory Visit (HOSPITAL_COMMUNITY): Payer: Self-pay

## 2013-07-31 LAB — BASIC METABOLIC PANEL
BUN: 124 mg/dL — ABNORMAL HIGH (ref 6–23)
CO2: 23 mEq/L (ref 19–32)
CREATININE: 4.36 mg/dL — AB (ref 0.50–1.35)
Calcium: 7.9 mg/dL — ABNORMAL LOW (ref 8.4–10.5)
Chloride: 92 mEq/L — ABNORMAL LOW (ref 96–112)
GFR calc Af Amer: 14 mL/min — ABNORMAL LOW (ref >90)
GFR, EST NON AFRICAN AMERICAN: 13 mL/min — AB (ref >90)
Glucose, Bld: 106 mg/dL — ABNORMAL HIGH (ref 70–99)
Potassium: 4.2 mEq/L (ref 3.7–5.3)
SODIUM: 132 meq/L — AB (ref 137–147)

## 2013-07-31 LAB — CBC
HCT: 29.6 % — ABNORMAL LOW (ref 39.0–52.0)
Hemoglobin: 10.3 g/dL — ABNORMAL LOW (ref 13.0–17.0)
MCH: 26 pg (ref 26.0–34.0)
MCHC: 34.8 g/dL (ref 30.0–36.0)
MCV: 74.7 fL — AB (ref 78.0–100.0)
Platelets: 80 10*3/uL — ABNORMAL LOW (ref 150–400)
RBC: 3.96 MIL/uL — ABNORMAL LOW (ref 4.22–5.81)
RDW: 18.9 % — AB (ref 11.5–15.5)
WBC: 8.3 10*3/uL (ref 4.0–10.5)

## 2013-07-31 LAB — PROTIME-INR
INR: 1.32 (ref 0.00–1.49)
PROTHROMBIN TIME: 16.1 s — AB (ref 11.6–15.2)

## 2013-07-31 MED ORDER — HEPARIN SODIUM (PORCINE) 1000 UNIT/ML IJ SOLN
INTRAMUSCULAR | Status: AC
  Filled 2013-07-31: qty 1

## 2013-07-31 NOTE — Sedation Documentation (Signed)
[facility] pt, floor RN here.  Pt on Milrinone drip and lasix drip.  Consent by niece.  Pt in IR room prepping for procedure.  Dr Bonnielee Haff has been in and seen pt.

## 2013-07-31 NOTE — Procedures (Signed)
RIJV temp HD catheter SVC RA

## 2013-08-01 LAB — CBC
HCT: 30.1 % — ABNORMAL LOW (ref 39.0–52.0)
Hemoglobin: 10.4 g/dL — ABNORMAL LOW (ref 13.0–17.0)
MCH: 26.1 pg (ref 26.0–34.0)
MCHC: 34.6 g/dL (ref 30.0–36.0)
MCV: 75.4 fL — ABNORMAL LOW (ref 78.0–100.0)
Platelets: 82 10*3/uL — ABNORMAL LOW (ref 150–400)
RBC: 3.99 MIL/uL — ABNORMAL LOW (ref 4.22–5.81)
RDW: 19.2 % — AB (ref 11.5–15.5)
WBC: 7.7 10*3/uL (ref 4.0–10.5)

## 2013-08-01 LAB — HEPATITIS B CORE ANTIBODY, TOTAL: HEP B C TOTAL AB: NONREACTIVE

## 2013-08-01 LAB — BASIC METABOLIC PANEL
BUN: 126 mg/dL — AB (ref 6–23)
CO2: 21 mEq/L (ref 19–32)
Calcium: 8.4 mg/dL (ref 8.4–10.5)
Chloride: 92 mEq/L — ABNORMAL LOW (ref 96–112)
Creatinine, Ser: 4.58 mg/dL — ABNORMAL HIGH (ref 0.50–1.35)
GFR, EST AFRICAN AMERICAN: 14 mL/min — AB (ref >90)
GFR, EST NON AFRICAN AMERICAN: 12 mL/min — AB (ref >90)
GLUCOSE: 89 mg/dL (ref 70–99)
POTASSIUM: 4.5 meq/L (ref 3.7–5.3)
SODIUM: 131 meq/L — AB (ref 137–147)

## 2013-08-01 LAB — HEPATITIS B SURFACE ANTIGEN: Hepatitis B Surface Ag: NEGATIVE

## 2013-08-01 LAB — HEPATITIS B SURFACE ANTIBODY,QUALITATIVE: Hep B S Ab: NEGATIVE

## 2013-08-02 ENCOUNTER — Other Ambulatory Visit (HOSPITAL_COMMUNITY): Payer: Self-pay

## 2013-08-02 LAB — CBC
HEMATOCRIT: 27.6 % — AB (ref 39.0–52.0)
HEMOGLOBIN: 9.4 g/dL — AB (ref 13.0–17.0)
MCH: 25.7 pg — AB (ref 26.0–34.0)
MCHC: 34.1 g/dL (ref 30.0–36.0)
MCV: 75.4 fL — ABNORMAL LOW (ref 78.0–100.0)
Platelets: 53 10*3/uL — ABNORMAL LOW (ref 150–400)
RBC: 3.66 MIL/uL — ABNORMAL LOW (ref 4.22–5.81)
RDW: 19.3 % — ABNORMAL HIGH (ref 11.5–15.5)
WBC: 7 10*3/uL (ref 4.0–10.5)

## 2013-08-02 LAB — RENAL FUNCTION PANEL
ALBUMIN: 2.6 g/dL — AB (ref 3.5–5.2)
Albumin: 2.6 g/dL — ABNORMAL LOW (ref 3.5–5.2)
BUN: 100 mg/dL — AB (ref 6–23)
BUN: 104 mg/dL — AB (ref 6–23)
CALCIUM: 8.6 mg/dL (ref 8.4–10.5)
CALCIUM: 8.7 mg/dL (ref 8.4–10.5)
CO2: 23 mEq/L (ref 19–32)
CO2: 23 meq/L (ref 19–32)
CREATININE: 4.12 mg/dL — AB (ref 0.50–1.35)
CREATININE: 4.15 mg/dL — AB (ref 0.50–1.35)
Chloride: 93 mEq/L — ABNORMAL LOW (ref 96–112)
Chloride: 93 mEq/L — ABNORMAL LOW (ref 96–112)
GFR calc Af Amer: 15 mL/min — ABNORMAL LOW (ref >90)
GFR calc Af Amer: 15 mL/min — ABNORMAL LOW (ref >90)
GFR calc non Af Amer: 13 mL/min — ABNORMAL LOW (ref >90)
GFR calc non Af Amer: 13 mL/min — ABNORMAL LOW (ref >90)
GLUCOSE: 80 mg/dL (ref 70–99)
GLUCOSE: 81 mg/dL (ref 70–99)
PHOSPHORUS: 3.7 mg/dL (ref 2.3–4.6)
PHOSPHORUS: 3.8 mg/dL (ref 2.3–4.6)
Potassium: 4.3 mEq/L (ref 3.7–5.3)
Potassium: 4.3 mEq/L (ref 3.7–5.3)
Sodium: 133 mEq/L — ABNORMAL LOW (ref 137–147)
Sodium: 134 mEq/L — ABNORMAL LOW (ref 137–147)

## 2013-08-03 ENCOUNTER — Other Ambulatory Visit (HOSPITAL_COMMUNITY): Payer: Self-pay

## 2013-08-03 LAB — CBC WITH DIFFERENTIAL/PLATELET
Basophils Absolute: 0 10*3/uL (ref 0.0–0.1)
Basophils Relative: 0 % (ref 0–1)
EOS PCT: 1 % (ref 0–5)
Eosinophils Absolute: 0 10*3/uL (ref 0.0–0.7)
HEMATOCRIT: 25.5 % — AB (ref 39.0–52.0)
HEMOGLOBIN: 9 g/dL — AB (ref 13.0–17.0)
LYMPHS ABS: 0.6 10*3/uL — AB (ref 0.7–4.0)
Lymphocytes Relative: 7 % — ABNORMAL LOW (ref 12–46)
MCH: 26.4 pg (ref 26.0–34.0)
MCHC: 35.3 g/dL (ref 30.0–36.0)
MCV: 74.8 fL — AB (ref 78.0–100.0)
MONO ABS: 0.9 10*3/uL (ref 0.1–1.0)
MONOS PCT: 11 % (ref 3–12)
Neutro Abs: 7.1 10*3/uL (ref 1.7–7.7)
Neutrophils Relative %: 82 % — ABNORMAL HIGH (ref 43–77)
Platelets: 45 10*3/uL — ABNORMAL LOW (ref 150–400)
RBC: 3.41 MIL/uL — AB (ref 4.22–5.81)
RDW: 20.3 % — ABNORMAL HIGH (ref 11.5–15.5)
WBC: 8.7 10*3/uL (ref 4.0–10.5)

## 2013-08-03 LAB — BASIC METABOLIC PANEL
BUN: 75 mg/dL — ABNORMAL HIGH (ref 6–23)
CALCIUM: 8.5 mg/dL (ref 8.4–10.5)
CO2: 22 mEq/L (ref 19–32)
CREATININE: 3.61 mg/dL — AB (ref 0.50–1.35)
Chloride: 97 mEq/L (ref 96–112)
GFR calc Af Amer: 18 mL/min — ABNORMAL LOW (ref >90)
GFR calc non Af Amer: 16 mL/min — ABNORMAL LOW (ref >90)
GLUCOSE: 91 mg/dL (ref 70–99)
Potassium: 4 mEq/L (ref 3.7–5.3)
Sodium: 135 mEq/L — ABNORMAL LOW (ref 137–147)

## 2013-08-03 LAB — PHOSPHORUS: Phosphorus: 3.2 mg/dL (ref 2.3–4.6)

## 2013-08-03 LAB — PRO B NATRIURETIC PEPTIDE: Pro B Natriuretic peptide (BNP): 32973 pg/mL — ABNORMAL HIGH (ref 0–125)

## 2013-08-03 LAB — MAGNESIUM: MAGNESIUM: 2.1 mg/dL (ref 1.5–2.5)

## 2013-08-04 LAB — CBC
HCT: 27.2 % — ABNORMAL LOW (ref 39.0–52.0)
Hemoglobin: 9.6 g/dL — ABNORMAL LOW (ref 13.0–17.0)
MCH: 26.4 pg (ref 26.0–34.0)
MCHC: 35.3 g/dL (ref 30.0–36.0)
MCV: 74.9 fL — AB (ref 78.0–100.0)
PLATELETS: 53 10*3/uL — AB (ref 150–400)
RBC: 3.63 MIL/uL — ABNORMAL LOW (ref 4.22–5.81)
RDW: 21.2 % — AB (ref 11.5–15.5)
WBC: 8.3 10*3/uL (ref 4.0–10.5)

## 2013-08-04 LAB — RENAL FUNCTION PANEL
ALBUMIN: 2.8 g/dL — AB (ref 3.5–5.2)
BUN: 83 mg/dL — AB (ref 6–23)
CHLORIDE: 96 meq/L (ref 96–112)
CO2: 17 mEq/L — ABNORMAL LOW (ref 19–32)
Calcium: 8.5 mg/dL (ref 8.4–10.5)
Creatinine, Ser: 4.04 mg/dL — ABNORMAL HIGH (ref 0.50–1.35)
GFR calc non Af Amer: 14 mL/min — ABNORMAL LOW (ref >90)
GFR, EST AFRICAN AMERICAN: 16 mL/min — AB (ref >90)
GLUCOSE: 106 mg/dL — AB (ref 70–99)
PHOSPHORUS: 3.7 mg/dL (ref 2.3–4.6)
Potassium: 5.9 mEq/L — ABNORMAL HIGH (ref 3.7–5.3)
Sodium: 131 mEq/L — ABNORMAL LOW (ref 137–147)

## 2013-08-05 LAB — BASIC METABOLIC PANEL
BUN: 41 mg/dL — ABNORMAL HIGH (ref 6–23)
CO2: 26 meq/L (ref 19–32)
Calcium: 8.6 mg/dL (ref 8.4–10.5)
Chloride: 99 mEq/L (ref 96–112)
Creatinine, Ser: 2.63 mg/dL — ABNORMAL HIGH (ref 0.50–1.35)
GFR calc non Af Amer: 23 mL/min — ABNORMAL LOW (ref >90)
GFR, EST AFRICAN AMERICAN: 27 mL/min — AB (ref >90)
Glucose, Bld: 76 mg/dL (ref 70–99)
POTASSIUM: 2.9 meq/L — AB (ref 3.7–5.3)
Sodium: 140 mEq/L (ref 137–147)

## 2013-08-05 LAB — POTASSIUM: POTASSIUM: 3 meq/L — AB (ref 3.7–5.3)

## 2013-08-05 NOTE — Progress Notes (Signed)
73 year old man with history of nonischemic cardiomyopathy and renal insufficiency.  The patient was initially admitted to [facility] with congestive heart failure/cardiogenic shock and episodes of nonsustained ventricular tachycardia he was stabilized with dobutamine, dopamine, IV diuretics, and amiodarone.  He was seen by palliative care and median limited code and was transferred to [facility] on 07/21/13 for further management.  On arrival here he was noted to be markedly fluid overloaded.  He has had persistent hypotension.  He has been on a milrinone drip for the past week without significant improvement.  He has required hemodialysis by nephrology.  He has had problems with hyperkalemia. The patient denies any chest discomfort or significant dyspnea at this time. Rhythm is normal sinus rhythm with frequent PVCs.  Blood pressure is in the range of 82/50. We are asked to consult regarding the continuation of his milrinone drip. On examination vital signs are as noted in chart.  Pressure in the range of 82/50 and the pulse is 84 and regular The chest reveals bibasilar rales. Heart reveals an S3 gallop The abdomen suggests presence of ascites and is firm and dull to percussion. Extremities reveal no significant pedal edema but he does have significant edema of his arms.  Impression: 1.  Nonischemic cardiomyopathy with severe volume overload. 2.  acute on chronic renal insufficiency requiring dialysis.  Recommendation: At this point there is no evidence to suggest that continuing the milrinone drip will help him.  In fact the milrinone maybe a contributing factor in his persistent hypotension.  Therefore I have stopped milrinone.  Continue dialysis as per nephrology.  Overall prognosis remains poor.

## 2013-08-06 ENCOUNTER — Other Ambulatory Visit (HOSPITAL_COMMUNITY): Payer: Self-pay

## 2013-08-06 DIAGNOSIS — I509 Heart failure, unspecified: Secondary | ICD-10-CM

## 2013-08-06 LAB — CBC
HEMATOCRIT: 27.9 % — AB (ref 39.0–52.0)
HEMOGLOBIN: 9.2 g/dL — AB (ref 13.0–17.0)
MCH: 26.3 pg (ref 26.0–34.0)
MCHC: 33 g/dL (ref 30.0–36.0)
MCV: 79.7 fL (ref 78.0–100.0)
Platelets: 49 10*3/uL — ABNORMAL LOW (ref 150–400)
RBC: 3.5 MIL/uL — AB (ref 4.22–5.81)
RDW: 23.7 % — ABNORMAL HIGH (ref 11.5–15.5)
WBC: 6.6 10*3/uL (ref 4.0–10.5)

## 2013-08-06 LAB — RENAL FUNCTION PANEL
Albumin: 2.9 g/dL — ABNORMAL LOW (ref 3.5–5.2)
BUN: 47 mg/dL — AB (ref 6–23)
CHLORIDE: 99 meq/L (ref 96–112)
CO2: 24 meq/L (ref 19–32)
Calcium: 8.8 mg/dL (ref 8.4–10.5)
Creatinine, Ser: 3.5 mg/dL — ABNORMAL HIGH (ref 0.50–1.35)
GFR calc Af Amer: 19 mL/min — ABNORMAL LOW (ref >90)
GFR calc non Af Amer: 16 mL/min — ABNORMAL LOW (ref >90)
GLUCOSE: 89 mg/dL (ref 70–99)
POTASSIUM: 4.1 meq/L (ref 3.7–5.3)
Phosphorus: 3.6 mg/dL (ref 2.3–4.6)
SODIUM: 138 meq/L (ref 137–147)

## 2013-08-06 LAB — PRO B NATRIURETIC PEPTIDE: PRO B NATRI PEPTIDE: 31378 pg/mL — AB (ref 0–125)

## 2013-08-06 NOTE — Progress Notes (Signed)
Patient's milrinone was stopped yesterday. Today he is clinically unchanged and his BP has improved from the 80s up to the 90s. Exam is unchanged. Plan: Continue off milrinone. Cardiology will sign off now.

## 2013-08-07 LAB — APTT: APTT: 38 s — AB (ref 24–37)

## 2013-08-07 LAB — HEPATIC FUNCTION PANEL
ALT: 17 U/L (ref 0–53)
AST: 21 U/L (ref 0–37)
Albumin: 2.7 g/dL — ABNORMAL LOW (ref 3.5–5.2)
Alkaline Phosphatase: 99 U/L (ref 39–117)
BILIRUBIN DIRECT: 3 mg/dL — AB (ref 0.0–0.3)
BILIRUBIN INDIRECT: 1.6 mg/dL — AB (ref 0.3–0.9)
Total Bilirubin: 4.6 mg/dL — ABNORMAL HIGH (ref 0.3–1.2)
Total Protein: 5.4 g/dL — ABNORMAL LOW (ref 6.0–8.3)

## 2013-08-07 LAB — CBC
HCT: 27.3 % — ABNORMAL LOW (ref 39.0–52.0)
HEMATOCRIT: 26.7 % — AB (ref 39.0–52.0)
HEMOGLOBIN: 9.2 g/dL — AB (ref 13.0–17.0)
Hemoglobin: 9 g/dL — ABNORMAL LOW (ref 13.0–17.0)
MCH: 26.7 pg (ref 26.0–34.0)
MCH: 26.9 pg (ref 26.0–34.0)
MCHC: 33.7 g/dL (ref 30.0–36.0)
MCHC: 33.7 g/dL (ref 30.0–36.0)
MCV: 79.4 fL (ref 78.0–100.0)
MCV: 79.9 fL (ref 78.0–100.0)
PLATELETS: 41 10*3/uL — AB (ref 150–400)
Platelets: 32 10*3/uL — ABNORMAL LOW (ref 150–400)
RBC: 3.44 MIL/uL — ABNORMAL LOW (ref 4.22–5.81)
RBC: 3.34 MIL/uL — ABNORMAL LOW (ref 4.22–5.81)
RDW: 24.9 % — ABNORMAL HIGH (ref 11.5–15.5)
RDW: 25.1 % — ABNORMAL HIGH (ref 11.5–15.5)
WBC: 6.7 10*3/uL (ref 4.0–10.5)
WBC: 6.4 10*3/uL (ref 4.0–10.5)

## 2013-08-07 LAB — BASIC METABOLIC PANEL
BUN: 33 mg/dL — ABNORMAL HIGH (ref 6–23)
CHLORIDE: 100 meq/L (ref 96–112)
CO2: 24 mEq/L (ref 19–32)
Calcium: 8.6 mg/dL (ref 8.4–10.5)
Creatinine, Ser: 2.92 mg/dL — ABNORMAL HIGH (ref 0.50–1.35)
GFR calc Af Amer: 23 mL/min — ABNORMAL LOW (ref >90)
GFR calc non Af Amer: 20 mL/min — ABNORMAL LOW (ref >90)
GLUCOSE: 93 mg/dL (ref 70–99)
POTASSIUM: 4.1 meq/L (ref 3.7–5.3)
Sodium: 139 mEq/L (ref 137–147)

## 2013-08-07 LAB — MAGNESIUM: Magnesium: 2.1 mg/dL (ref 1.5–2.5)

## 2013-08-07 LAB — PHOSPHORUS: PHOSPHORUS: 3.2 mg/dL (ref 2.3–4.6)

## 2013-08-07 LAB — PROTIME-INR
INR: 1.54 — AB (ref 0.00–1.49)
Prothrombin Time: 18.1 seconds — ABNORMAL HIGH (ref 11.6–15.2)

## 2013-08-08 LAB — CBC
HCT: 29.1 % — ABNORMAL LOW (ref 39.0–52.0)
HEMOGLOBIN: 9.4 g/dL — AB (ref 13.0–17.0)
MCH: 26.5 pg (ref 26.0–34.0)
MCHC: 32.3 g/dL (ref 30.0–36.0)
MCV: 82 fL (ref 78.0–100.0)
Platelets: 40 10*3/uL — ABNORMAL LOW (ref 150–400)
RBC: 3.55 MIL/uL — AB (ref 4.22–5.81)
RDW: 25.2 % — ABNORMAL HIGH (ref 11.5–15.5)
WBC: 6.6 10*3/uL (ref 4.0–10.5)

## 2013-08-09 LAB — CBC
HEMATOCRIT: 26 % — AB (ref 39.0–52.0)
Hemoglobin: 8.4 g/dL — ABNORMAL LOW (ref 13.0–17.0)
MCH: 26.5 pg (ref 26.0–34.0)
MCHC: 32.3 g/dL (ref 30.0–36.0)
MCV: 82 fL (ref 78.0–100.0)
Platelets: 35 10*3/uL — ABNORMAL LOW (ref 150–400)
RBC: 3.17 MIL/uL — AB (ref 4.22–5.81)
RDW: 25.6 % — ABNORMAL HIGH (ref 11.5–15.5)
WBC: 7.3 10*3/uL (ref 4.0–10.5)

## 2013-08-09 LAB — BASIC METABOLIC PANEL
BUN: 24 mg/dL — AB (ref 6–23)
CHLORIDE: 101 meq/L (ref 96–112)
CO2: 26 meq/L (ref 19–32)
Calcium: 8.8 mg/dL (ref 8.4–10.5)
Creatinine, Ser: 2.74 mg/dL — ABNORMAL HIGH (ref 0.50–1.35)
GFR calc Af Amer: 25 mL/min — ABNORMAL LOW (ref >90)
GFR, EST NON AFRICAN AMERICAN: 22 mL/min — AB (ref >90)
Glucose, Bld: 104 mg/dL — ABNORMAL HIGH (ref 70–99)
Potassium: 4.1 mEq/L (ref 3.7–5.3)
SODIUM: 140 meq/L (ref 137–147)

## 2013-08-10 LAB — CBC
HEMATOCRIT: 30.4 % — AB (ref 39.0–52.0)
Hemoglobin: 9.7 g/dL — ABNORMAL LOW (ref 13.0–17.0)
MCH: 26.4 pg (ref 26.0–34.0)
MCHC: 31.9 g/dL (ref 30.0–36.0)
MCV: 82.8 fL (ref 78.0–100.0)
Platelets: 66 10*3/uL — ABNORMAL LOW (ref 150–400)
RBC: 3.67 MIL/uL — AB (ref 4.22–5.81)
RDW: 26 % — ABNORMAL HIGH (ref 11.5–15.5)
WBC: 5.7 10*3/uL (ref 4.0–10.5)

## 2013-08-10 LAB — DIRECT ANTIGLOBULIN TEST (NOT AT ARMC)
DAT, COMPLEMENT: NEGATIVE
DAT, IgG: NEGATIVE

## 2013-08-11 ENCOUNTER — Other Ambulatory Visit (HOSPITAL_COMMUNITY): Payer: Medicare Other

## 2013-08-11 ENCOUNTER — Ambulatory Visit: Payer: Self-pay | Admitting: Internal Medicine

## 2013-08-11 LAB — COMPREHENSIVE METABOLIC PANEL
ALT: 20 U/L (ref 0–53)
AST: 29 U/L (ref 0–37)
Albumin: 2.8 g/dL — ABNORMAL LOW (ref 3.5–5.2)
Alkaline Phosphatase: 91 U/L (ref 39–117)
BUN: 42 mg/dL — ABNORMAL HIGH (ref 6–23)
CALCIUM: 8.7 mg/dL (ref 8.4–10.5)
CO2: 22 meq/L (ref 19–32)
CREATININE: 4.41 mg/dL — AB (ref 0.50–1.35)
Chloride: 101 mEq/L (ref 96–112)
GFR calc non Af Amer: 12 mL/min — ABNORMAL LOW (ref >90)
GFR, EST AFRICAN AMERICAN: 14 mL/min — AB (ref >90)
Glucose, Bld: 144 mg/dL — ABNORMAL HIGH (ref 70–99)
Potassium: 4.8 mEq/L (ref 3.7–5.3)
Sodium: 142 mEq/L (ref 137–147)
Total Bilirubin: 5 mg/dL — ABNORMAL HIGH (ref 0.3–1.2)
Total Protein: 6.2 g/dL (ref 6.0–8.3)

## 2013-08-11 LAB — CBC
HEMATOCRIT: 31.3 % — AB (ref 39.0–52.0)
HEMOGLOBIN: 9.9 g/dL — AB (ref 13.0–17.0)
MCH: 26.3 pg (ref 26.0–34.0)
MCHC: 31.6 g/dL (ref 30.0–36.0)
MCV: 83 fL (ref 78.0–100.0)
Platelets: 66 10*3/uL — ABNORMAL LOW (ref 150–400)
RBC: 3.77 MIL/uL — AB (ref 4.22–5.81)
RDW: 26.1 % — ABNORMAL HIGH (ref 11.5–15.5)
WBC: 5.2 10*3/uL (ref 4.0–10.5)

## 2013-08-11 LAB — PREALBUMIN: Prealbumin: 9.6 mg/dL — ABNORMAL LOW (ref 17.0–34.0)

## 2013-08-11 LAB — HEMOGLOBIN A1C
HEMOGLOBIN A1C: 4.7 % (ref <5.7)
Mean Plasma Glucose: 88 mg/dL (ref <117)

## 2013-08-11 LAB — PHOSPHORUS: Phosphorus: 4.5 mg/dL (ref 2.3–4.6)

## 2013-08-13 LAB — RENAL FUNCTION PANEL
ALBUMIN: 2.9 g/dL — AB (ref 3.5–5.2)
BUN: 35 mg/dL — ABNORMAL HIGH (ref 6–23)
CO2: 26 mEq/L (ref 19–32)
Calcium: 8.8 mg/dL (ref 8.4–10.5)
Chloride: 101 mEq/L (ref 96–112)
Creatinine, Ser: 4.14 mg/dL — ABNORMAL HIGH (ref 0.50–1.35)
GFR calc Af Amer: 15 mL/min — ABNORMAL LOW (ref >90)
GFR, EST NON AFRICAN AMERICAN: 13 mL/min — AB (ref >90)
GLUCOSE: 95 mg/dL (ref 70–99)
PHOSPHORUS: 4.8 mg/dL — AB (ref 2.3–4.6)
POTASSIUM: 4.5 meq/L (ref 3.7–5.3)
Sodium: 142 mEq/L (ref 137–147)

## 2013-08-13 LAB — CBC
HCT: 31 % — ABNORMAL LOW (ref 39.0–52.0)
HCT: 28.9 % — ABNORMAL LOW (ref 39.0–52.0)
HEMOGLOBIN: 9.5 g/dL — AB (ref 13.0–17.0)
Hemoglobin: 9.9 g/dL — ABNORMAL LOW (ref 13.0–17.0)
MCH: 26.3 pg (ref 26.0–34.0)
MCH: 26.7 pg (ref 26.0–34.0)
MCHC: 31.9 g/dL (ref 30.0–36.0)
MCHC: 32.9 g/dL (ref 30.0–36.0)
MCV: 82.2 fL (ref 78.0–100.0)
MCV: 81.2 fL (ref 78.0–100.0)
PLATELETS: 42 10*3/uL — AB (ref 150–400)
PLATELETS: 24 10*3/uL — AB (ref 150–400)
RBC: 3.77 MIL/uL — ABNORMAL LOW (ref 4.22–5.81)
RBC: 3.56 MIL/uL — AB (ref 4.22–5.81)
RDW: 25.1 % — AB (ref 11.5–15.5)
RDW: 24.9 % — ABNORMAL HIGH (ref 11.5–15.5)
WBC: 5.2 10*3/uL (ref 4.0–10.5)
WBC: 6 10*3/uL (ref 4.0–10.5)

## 2013-08-14 LAB — CBC
HCT: 29.9 % — ABNORMAL LOW (ref 39.0–52.0)
HEMOGLOBIN: 9.6 g/dL — AB (ref 13.0–17.0)
MCH: 26.4 pg (ref 26.0–34.0)
MCHC: 32.1 g/dL (ref 30.0–36.0)
MCV: 82.4 fL (ref 78.0–100.0)
PLATELETS: 33 10*3/uL — AB (ref 150–400)
RBC: 3.63 MIL/uL — AB (ref 4.22–5.81)
RDW: 24.9 % — ABNORMAL HIGH (ref 11.5–15.5)
WBC: 7.2 10*3/uL (ref 4.0–10.5)

## 2013-08-14 LAB — BASIC METABOLIC PANEL
BUN: 30 mg/dL — ABNORMAL HIGH (ref 6–23)
CO2: 26 meq/L (ref 19–32)
Calcium: 8.9 mg/dL (ref 8.4–10.5)
Chloride: 101 mEq/L (ref 96–112)
Creatinine, Ser: 3.61 mg/dL — ABNORMAL HIGH (ref 0.50–1.35)
GFR calc Af Amer: 18 mL/min — ABNORMAL LOW (ref >90)
GFR calc non Af Amer: 16 mL/min — ABNORMAL LOW (ref >90)
GLUCOSE: 65 mg/dL — AB (ref 70–99)
POTASSIUM: 4.5 meq/L (ref 3.7–5.3)
SODIUM: 143 meq/L (ref 137–147)

## 2013-08-15 LAB — RENAL FUNCTION PANEL
ALBUMIN: 2.7 g/dL — AB (ref 3.5–5.2)
BUN: 44 mg/dL — ABNORMAL HIGH (ref 6–23)
CALCIUM: 8.9 mg/dL (ref 8.4–10.5)
CO2: 26 mEq/L (ref 19–32)
Chloride: 102 mEq/L (ref 96–112)
Creatinine, Ser: 4.28 mg/dL — ABNORMAL HIGH (ref 0.50–1.35)
GFR calc Af Amer: 15 mL/min — ABNORMAL LOW (ref >90)
GFR calc non Af Amer: 13 mL/min — ABNORMAL LOW (ref >90)
GLUCOSE: 147 mg/dL — AB (ref 70–99)
PHOSPHORUS: 4.2 mg/dL (ref 2.3–4.6)
POTASSIUM: 4.8 meq/L (ref 3.7–5.3)
Sodium: 143 mEq/L (ref 137–147)

## 2013-08-15 LAB — CBC WITH DIFFERENTIAL/PLATELET
BASOS ABS: 0 10*3/uL (ref 0.0–0.1)
Basophils Relative: 0 % (ref 0–1)
EOS PCT: 1 % (ref 0–5)
Eosinophils Absolute: 0.1 10*3/uL (ref 0.0–0.7)
HEMATOCRIT: 30 % — AB (ref 39.0–52.0)
Hemoglobin: 9.7 g/dL — ABNORMAL LOW (ref 13.0–17.0)
Lymphocytes Relative: 11 % — ABNORMAL LOW (ref 12–46)
Lymphs Abs: 0.6 10*3/uL — ABNORMAL LOW (ref 0.7–4.0)
MCH: 26.3 pg (ref 26.0–34.0)
MCHC: 32.3 g/dL (ref 30.0–36.0)
MCV: 81.3 fL (ref 78.0–100.0)
Monocytes Absolute: 0.6 10*3/uL (ref 0.1–1.0)
Monocytes Relative: 11 % (ref 3–12)
Neutro Abs: 4.5 10*3/uL (ref 1.7–7.7)
Neutrophils Relative %: 77 % (ref 43–77)
PLATELETS: 41 10*3/uL — AB (ref 150–400)
RBC: 3.69 MIL/uL — ABNORMAL LOW (ref 4.22–5.81)
RDW: 24.5 % — AB (ref 11.5–15.5)
WBC: 5.8 10*3/uL (ref 4.0–10.5)

## 2013-08-18 LAB — CBC
HEMATOCRIT: 28.4 % — AB (ref 39.0–52.0)
HEMOGLOBIN: 9.6 g/dL — AB (ref 13.0–17.0)
MCH: 26.4 pg (ref 26.0–34.0)
MCHC: 33.8 g/dL (ref 30.0–36.0)
MCV: 78 fL (ref 78.0–100.0)
Platelets: 37 10*3/uL — ABNORMAL LOW (ref 150–400)
RBC: 3.64 MIL/uL — ABNORMAL LOW (ref 4.22–5.81)
RDW: 24.2 % — AB (ref 11.5–15.5)
WBC: 6.1 10*3/uL (ref 4.0–10.5)

## 2013-08-18 LAB — RENAL FUNCTION PANEL
Albumin: 2.8 g/dL — ABNORMAL LOW (ref 3.5–5.2)
BUN: 59 mg/dL — AB (ref 6–23)
CALCIUM: 8.8 mg/dL (ref 8.4–10.5)
CO2: 25 mEq/L (ref 19–32)
Chloride: 101 mEq/L (ref 96–112)
Creatinine, Ser: 5.3 mg/dL — ABNORMAL HIGH (ref 0.50–1.35)
GFR calc Af Amer: 11 mL/min — ABNORMAL LOW (ref >90)
GFR calc non Af Amer: 10 mL/min — ABNORMAL LOW (ref >90)
GLUCOSE: 106 mg/dL — AB (ref 70–99)
Phosphorus: 4.6 mg/dL (ref 2.3–4.6)
Potassium: 4.6 mEq/L (ref 3.7–5.3)
Sodium: 144 mEq/L (ref 137–147)

## 2013-08-19 LAB — BASIC METABOLIC PANEL
BUN: 55 mg/dL — ABNORMAL HIGH (ref 6–23)
CO2: 24 meq/L (ref 19–32)
CREATININE: 5.03 mg/dL — AB (ref 0.50–1.35)
Calcium: 9.1 mg/dL (ref 8.4–10.5)
Chloride: 101 mEq/L (ref 96–112)
GFR calc Af Amer: 12 mL/min — ABNORMAL LOW (ref >90)
GFR calc non Af Amer: 10 mL/min — ABNORMAL LOW (ref >90)
Glucose, Bld: 82 mg/dL (ref 70–99)
POTASSIUM: 4.9 meq/L (ref 3.7–5.3)
Sodium: 144 mEq/L (ref 137–147)

## 2013-08-19 LAB — CBC WITH DIFFERENTIAL/PLATELET
Basophils Absolute: 0.1 10*3/uL (ref 0.0–0.1)
Basophils Relative: 1 % (ref 0–1)
EOS PCT: 1 % (ref 0–5)
Eosinophils Absolute: 0.1 10*3/uL (ref 0.0–0.7)
HEMATOCRIT: 29.8 % — AB (ref 39.0–52.0)
Hemoglobin: 9.6 g/dL — ABNORMAL LOW (ref 13.0–17.0)
LYMPHS ABS: 0.7 10*3/uL (ref 0.7–4.0)
LYMPHS PCT: 11 % — AB (ref 12–46)
MCH: 26 pg (ref 26.0–34.0)
MCHC: 32.2 g/dL (ref 30.0–36.0)
MCV: 80.8 fL (ref 78.0–100.0)
MONOS PCT: 9 % (ref 3–12)
Monocytes Absolute: 0.6 10*3/uL (ref 0.1–1.0)
NEUTROS ABS: 5.2 10*3/uL (ref 1.7–7.7)
Neutrophils Relative %: 78 % — ABNORMAL HIGH (ref 43–77)
Platelets: 36 10*3/uL — ABNORMAL LOW (ref 150–400)
RBC: 3.69 MIL/uL — AB (ref 4.22–5.81)
RDW: 24.5 % — ABNORMAL HIGH (ref 11.5–15.5)
WBC: 6.7 10*3/uL (ref 4.0–10.5)

## 2013-08-19 LAB — PROCALCITONIN: Procalcitonin: 5.37 ng/mL

## 2013-08-20 LAB — CULTURE, BLOOD (SINGLE)

## 2013-08-21 LAB — HEPARIN INDUCED THROMBOCYTOPENIA PNL
Heparin Induced Plt Ab: POSITIVE
Patient O.D.: 1.929
UFH High Dose UFH H: 7 % Release
UFH LOW DOSE 0.1 IU/ML: 4 %
UFH LOW DOSE 0.5 IU/ML: 3 %
UFH SRA RESULT: NEGATIVE

## 2013-09-10 DEATH — deceased

## 2014-07-04 NOTE — Discharge Summary (Signed)
PATIENT NAME:  Isaac Collins, Isaac Collins MR#:  [id number] DATE OF BIRTH:  27-Jan-1941  DATE OF ADMISSION:  03/28/2013 DATE OF DISCHARGE:  04/01/2013  DISCHARGE DIAGNOSES:  1.  Acute-and-chronic systolic congestive heart failure.  2.  Nonischemic cardiomyopathy.  3.  Chronic kidney disease.  4.  Chronic obstructive pulmonary disease.   CHIEF COMPLAINT: Shortness of breath and weight gain.   HISTORY OF PRESENT ILLNESS: Hoskie Deloe is a 74 year old male patient with a history of systolic congestive heart failure; chronic kidney disease, stage III; who was brought to the clinic because of increasing shortness of breath and weight gain. The patient was actually sent from cardiology office because of persistent weight gain of 16 pounds over 2 weeks and in spite of getting extra dose of torsemide, he continued to be symptomatic with shortness of breath and pedal edema. The patient denies any chest pain. His creatinine was noted to be elevated at 3.11  and BNP of 25,000, and the patient was started on Lasix drip as per advise of Dr. Wynelle Link. The patient had also been seeing Dr. Kirke Corin, cardiologist at [facility].   PAST MEDICAL HISTORY: Significant for systolic heart failure, class III; coronary artery insufficiency, stage IV; COPD, EF of 25%; and long-standing ischemic cardiomyopathy since 2008.   PHYSICAL EXAMINATION:  VITAL SIGNS: Heart rate is 120. Blood pressure 102/77, oxygen saturation 97% on room air. Temperature 97.9.  GENERAL: Thin built, cooperative.  NECK: Supple. JVD was increased.  LUNGS: Bibasilar crackles.  HEART: S1, S2 with a 2/6 systolic murmur in the left sternal border.  ABDOMEN: Distended.  EXTREMITIES: Show 3+ edema with evidence of scrotal edema.  NEUROLOGIC: Nonfocal.   LABORATORY, DIAGNOSTIC AND RADIOLOGICAL DATA: Troponin less than 0.02. Chest x-ray shows cardiomegaly with small pleural effusions. BNP was 2586.  WBC count 7.7, hemoglobin 13.3, hematocrit 41, platelets 153. Sodium 139,  potassium 4.1, chloride 104, bicarb 25, BUN 66, creatinine 3.11, glucose 111. GFR was 19.   EKG showed sinus tachycardia with PVCs.   BRIEF HOSPITAL COURSE: The patient was admitted to [facility] and started on intravenous Lasix drip. He also had a rash on his left leg for which he received hydrocortisone cream. During his stay in the hospital, he was seen in consultation by nephrologist, Dr. Wynelle Link, as well as Dr. Kirke Corin, cardiologist. He did have some discomfort from his Foley catheter that was removed. The overall edema was significantly better, and the patient's renal function also improved with serum creatinine 2.91 and BUN 56 at time of discharge.   An ultrasound of his kidneys was negative for hydronephrosis or other acute renal parenchymal abnormality. There was a small simple cyst in the upper pole of the right kidney.   EKG shows sinus tach with occasional PVCs, left atrial enlargement, left axis deviation and septal infarct, unchanged from 2008.   The patient was stable at the time of discharge and his IV Lasix drip was switched to torsemide and he was discharged in stable condition on the following medications.   DISCHARGE MEDICATIONS: Torsemide 100 mg one tablet b.i.d., carvedilol 6.25 mg b.i.d.,  allopurinol 100 mg once a day, isosorbide mononitrate 30 mg once a day, digoxin 125 mcg once a day. His spironolactone hydralazine was discontinued.   DISCHARGE INSTRUCTIONS: He was advised low sodium diet and follow up with Dr. Kirke Corin, follow up with Dr. Wynelle Link as well as follow up with Dr. Marcello Fennel in 1 to 2 weeks' time.   The patient is stable at the time of discharge. He  has been advised to call back with any questions or concerns.   TOTAL TIME SPENT IN DISCHARGING THE PATIENT: 35 minutes.   ____________________________ Barbette Reichmann, MD vh:np D: 04/04/2013 17:10:31 ET T: 04/04/2013 19:57:41 ET JOB#: [id number]  cc: Barbette Reichmann, MD, <Dictator> Barbette Reichmann MD ELECTRONICALLY  SIGNED 04/22/2013 13:19

## 2014-07-04 NOTE — Consult Note (Signed)
General Aspect Isaac Collins is a very pleasant 74 year old gentleman with a history of nonischemic cardiomyopathy, history of severe congestive heart failure EF <25% who was placed on hospice in 2008 who had been doing very well since that time on aggressive medical management and diuresis (came off hospice).  At the end of January 2013 he had weight gain, lower extremity edema, increasing shortness of breath,improved with aggressive diuresis. Improvement with diuresis Recent hospitalization January 2015 for acute on chronic systolic CHF. Treated with Lasix IV with improvement of symptoms discharged on torsemide 100 mg twice a day. He presents today for routine followup.  Seen in the office yesterday. He reported taking torsemide 100 mg daily, not twice a day. Uncertain why he decreased the dose. Family was under the impression he was taking torsemide 100 mg twice a day. He reports having significant weight gain, groin swelling, worsening shortness of breath. Previously admitted to the hospital in January of this year with weight in the 147 range. Weight today is 155. Baseline weight is in the low 130 range. Worsening renal function over the past year, especially 6 months. Typically there is medication confusion when he does it and family is needed to manage his medications.  niece does most of his medications.  sister recently had a stroke in August of 2014. Recent loss in the family, nieces mother. Very stressful period As this was 2 weeks ago.  Severe worsening leg edema, abdominal swelling, groin swelling  EKG showed normal sinus rhythm with rate 102 beats per minute with poor R-wave progression through the anterior precordial leads, left axis deviation  Echocardiogram done May 2014 showing severely depressed ejection fraction, normal right ventricular systolic pressures, ejection fraction 20-25%   Present Illness . FAMILY HISTORY: Family history is negative for early coronary artery disease or  hypertension. Admits to patient's sister having cerebrovascular accident as well as congestive heart failure. The patient's mother died of unknown cancer at age 48. The patient's father died from motor vehicle accident.  SOCIAL HISTORY: The patient is single. He does not have any children.  He used to smoke approximately one-half pack per day since approximately ten years ago. He denies any alcohol abuse recently. He used to socially drink in the past. 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   Physical Exam:  GEN well developed, thin   HEENT hearing intact to voice, moist oral mucosa   RESP normal resp effort  crackles   CARD Regular rate and rhythm  Tachycardic  S4  Murmur   Murmur Systolic   Systolic Murmur Out flow   ABD denies tenderness  soft   EXTR positive edema, woody edema of LE   SKIN normal to palpation   NEURO motor/sensory function intact   PSYCH alert, A+O to time, place, person, good insight   Review of Systems:  Subjective/Chief Complaint weak, SOB, groin swelling, leg swelling   General: Fatigue  Weakness   Skin: No Complaints   ENT: No Complaints   Eyes: No Complaints   Neck: No Complaints   Respiratory: Short of breath   Cardiovascular: Dyspnea  Edema   Gastrointestinal: No Complaints   Genitourinary: No Complaints   Vascular: No Complaints   Musculoskeletal: No Complaints   Neurologic: No Complaints   Hematologic: No Complaints   Endocrine: No Complaints   Psychiatric: No Complaints   Review of Systems: All other systems were reviewed and found to be negative   Medications/Allergies Reviewed Medications/Allergies reviewed  hypernatrimia:    Hypertension:    scrotal hernia:    Cardiomyopathy:    COPD:    Hemorrhoids:    Irregular Heart Beat:    CHF:    Hernia Repair:   Home Medications: Medication Instructions Status  torsemide 100 mg oral tablet 1 tab(s) orally 2 times a day Active   carvedilol 6.25 mg oral tablet 1 tab(s) orally 2 times a day Active  allopurinol 100 mg oral tablet 1 tab(s) orally once a day Active  isosorbide mononitrate 30 mg oral tablet, extended release 1 tab(s) orally once a day Active  digoxin 125 mcg (0.125 mg) oral tablet 1 tab(s) orally once a day Active  Hydrocort cream 2.5% topical cream Apply topically to affected area 2 times a day Active  ketoconazole topical 2% topical cream Apply topically to affected area 2 times a day Active   Lab Results:  Routine Chem:  11-Apr-15 03:15   Glucose, Serum 99  BUN  58  Creatinine (comp)  3.16  Sodium, Serum 140  Potassium, Serum 3.6  Chloride, Serum  109  CO2, Serum 24  Calcium (Total), Serum  7.9  Anion Gap 7  Osmolality (calc) 296  eGFR (African American)  22  eGFR (Non-African American)  19 (eGFR values <36m/min/1.73 m2 may be an indication of chronic kidney disease (CKD). Calculated eGFR is useful in patients with stable renal function. The eGFR calculation will not be reliable in acutely ill patients when serum creatinine is changing rapidly. It is not useful in  patients on dialysis. The eGFR calculation may not be applicable to patients at the low and high extremes of body sizes, pregnant women, and vegetarians.)  Magnesium, Serum 1.8 (1.8-2.4 THERAPEUTIC RANGE: 4-7 mg/dL TOXIC: > 10 mg/dL  -----------------------)  Result Comment labs - This specimen was collected through an   - indwelling catheter or arterial line.  - A minimum of 564m of blood was wasted prior    - to collecting the sample.  Interpret  - results with caution.  Result(s) reported on 21 Jun 2013 at 03:49AM.  B-Type Natriuretic Peptide ([facility] [id number], Serum 82  Triglycerides, Serum 51  HDL (INHOUSE)  30  VLDL Cholesterol Calculated 10  LDL Cholesterol Calculated 42 (Result(s) reported on 21 Jun 2013 at 03:49AM.)  Cardiac:  11-Apr-15 03:15   CK, Total 92 (39-308 NOTE: NEW REFERENCE RANGE   04/14/2013)   Radiology Results: XRay:    10-Apr-15 13:16, Chest Portable Single View  Chest Portable Single View   REASON FOR EXAM:    sob, chf, eval  COMMENTS:       PROCEDURE: DXR - DXR PORTABLE CHEST SINGLE VIEW  - Jun 20 2013  1:16PM     CLINICAL DATA:  Shortness of breath.    EXAM:  PORTABLE CHEST - 1 VIEW    COMPARISON:  03/28/2013.    FINDINGS:  Cardiomegaly is present. No pulmonary venous congestion noted. Mild  interstitial prominence with small right pleural effusion present.  Findings consistent mild congestive heart failure. These findings  have improved from prior study .     IMPRESSION:  1. Improving congestive heart failure and pulmonary interstitial  edema.  2. Tiny right pleural effusion remains unchanged.      Electronically Signed    By: ThMarcello MooresRegister    On: 06/20/2013 14:20         Verified By: THOsa CraverM.D., MD    No Known Allergies:   Vital Signs/Nurse's Notes: **  Vital Signs.:   11-Apr-15 08:29  Vital Signs Type Routine  Temperature Temperature (F) 97.5  Celsius 36.3  Temperature Source oral  Pulse Pulse 98  Respirations Respirations 19  Systolic BP Systolic BP 888  Diastolic BP (mmHg) Diastolic BP (mmHg) 78  Mean BP 86  Pulse Ox % Pulse Ox % 100  Oxygen Delivery 2L; Nasal Cannula  Pulse Ox Heart Rate 90    Impression 1) Acute on chronic combined systolic and diastolic CHF, NYHA class 3 -    Worsening heart failure yesterday in clinic, sent for admission to the ER. Significant shortness of breath, leg and groin swelling, abdominal swelling.  moderate distress yesterday, very high weight for his baseline, admitted to the hospital for IV inotrope enhanced diuresis.  dopamine 5 mg IV infusion with Lasix  drip So far has god urine outpt [place] continue at ccurrent rate, based on echo has massive fluid overloasd.  Tricuspid regurg now severe from volume overload, leaflet seperation.       2)  Nonischemic dilated  cardiomyopathy -    Severely depressed ejection fraction. Echo showing EF 10 to 20% Needs continued diuresis. Last night had no significant diuresis until dopamine infusion was added.      3)  Chronic renal insufficiency, stage IV (severe)  Suspect cardiorenal syndrome with underlying renal dysfunction Renal function may imrprove slowly with increased cardiac outpt/after diuresis      4)  Tachycardia    Underlying tachycardia likely from severe heart failure/trying to maintain cardiac outpt slight increase from baseline from dopamine Monitor for now      5)  Testicular swelling -   I believe he has a hydrocele. Symptoms severely exacerbated by heart failure on today's visit  6) NSVT short runs oovernight at 3 AM if he continues to have long runs, need to check electrolytes replete potyassium and mag may need to start amiodarone infusion for more NSVT   Electronic Signatures: Ida Rogue (MD)  (Signed 11-Apr-15 11:14)  Authored: General Aspect/Present Illness, History and Physical Exam, Review of System, Past Medical History, Home Medications, Labs, Radiology, Allergies, Vital Signs/Nurse's Notes, Impression/Plan   Last Updated: 11-Apr-15 11:14 by Ida Rogue (MD)

## 2014-07-04 NOTE — Consult Note (Signed)
 General Aspect Isaac Collins is a 74 y.o.  male with a long-standing history of a nonischemic cardiomyopathy since 2008.  Apparently he had a history of heavy alcohol abuse that was the likely culprit etiology for his heart failure. He is a patient of Dr. Gollan, who was seen recently bt Dr. Harding due to worsening fluid overload and 16 lbs weight gain. He was given increased dose of Demadex but did not respond. Thus,he was directed to go to the ED.  He was seen in November, and there was concern the potentially dehydrated. Therefore his torsemide dose was reduced.   He denies any chest tightness or pressure. No palpitations or irregular heartbeats. No lightheadedness dizziness or wooziness. No syncope near syncope. He does have advanced CKD.  He was started on Lasix drip with good uring output. He is Negative 1.5 L so far . He is feeling better.   Physical Exam:  GEN no acute distress   NECK supple  + JVD   RESP normal resp effort  clear BS   CARD Regular rate and rhythm  No murmur   LYMPH negative neck   EXTR positive edema   NEURO cranial nerves intact   PSYCH alert, A+O to time, place, person   Review of Systems:  Subjective/Chief Complaint weight gain and edema   General: Fatigue   Skin: No Complaints   ENT: No Complaints   Eyes: No Complaints   Neck: No Complaints   Respiratory: Short of breath   Cardiovascular: Dyspnea  Edema   Gastrointestinal: No Complaints   Genitourinary: No Complaints   Vascular: No Complaints   Musculoskeletal: No Complaints   Neurologic: No Complaints   Hematologic: No Complaints   Endocrine: No Complaints   Psychiatric: No Complaints   Review of Systems: All other systems were reviewed and found to be negative     hypernatrimia:    Hypertension:    scrotal hernia:    Cardiomyopathy:    COPD:    Hemorrhoids:    Irregular Heart Beat:    CHF:    Hernia Repair:   Home Medications: Medication Instructions  Status  allopurinol 300 mg oral tablet 1 tab(s) orally once a day Active  carvedilol 12.5 mg oral tablet 1 tab(s) orally 2 times a day Active  spironolactone 25 mg oral tablet 1 tab(s) orally once a day Active  hydrALAZINE 10 mg oral tablet 1 tab(s) orally 2 times a day Active  ibuprofen 400 mg oral tablet 1 tab(s) orally every 6 hours, As Needed - for Pain Active  torsemide 20 mg oral tablet 2 tabs (40mg) orally 2 times a day Active   Lab Results: Routine Chem:  17-Jan-15 04:51   Glucose, Serum  103  BUN  64  Creatinine (comp)  3.04  Sodium, Serum  135  Potassium, Serum 4.1  Chloride, Serum 99  CO2, Serum 29  Calcium (Total), Serum 9.1  Anion Gap 7  Osmolality (calc) 289  eGFR (African American)  23  eGFR (Non-African American)  20 (eGFR values <60mL/min/1.73 m2 may be an indication of chronic kidney disease (CKD). Calculated eGFR is useful in patients with stable renal function. The eGFR calculation will not be reliable in acutely ill patients when serum creatinine is changing rapidly. It is not useful in  patients on dialysis. The eGFR calculation may not be applicable to patients at the low and high extremes of body sizes, pregnant women, and vegetarians.)  Routine Hem:  17-Jan-15 04:51   WBC (CBC)    12.3  RBC (CBC) 4.62  Hemoglobin (CBC) 13.1  Hematocrit (CBC) 40.8  Platelet Count (CBC) 159  MCV 88  MCH 28.4  MCHC 32.1  RDW  16.4  Neutrophil % 84.0  Lymphocyte % 7.2  Monocyte % 7.5  Eosinophil % 0.5  Basophil % 0.8  Neutrophil #  10.3  Lymphocyte #  0.9  Monocyte # 0.9  Eosinophil # 0.1  Basophil # 0.1 (Result(s) reported on 29 Mar 2013 at 05:30AM.)   EKG:  Interpretation sinus tachycardia with septal infract    No Known Allergies:   Vital Signs/Nurse's Notes: **Vital Signs.:   17-Jan-15 04:56  Vital Signs Type Routine  Temperature Temperature (F) 97.7  Celsius 36.5  Temperature Source oral  Pulse Pulse 89  Respirations Respirations 15  Systolic  BP Systolic BP 98  Diastolic BP (mmHg) Diastolic BP (mmHg) 67  Mean BP 77  Pulse Ox % Pulse Ox % 97  Pulse Ox Activity Level  At rest  Oxygen Delivery Room Air/ 21 %    Impression 1. Acute on chronic systolic heart failure with severely reduced LVSF: Most recent echo in 07/2012 showed an EF of 25% with mild MR.  agree with Lasix drip.  Continue Coreg.  Avoid ACE I or Aldactone due to advanced CKD.  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   2. CKD: monitor renal function with diuresis.   Electronic Signatures: Kathlyn Sacramento (MD)  (Signed 17-Jan-15 11:24)  Authored: General Aspect/Present Illness, History and Physical Exam, Review of System, Past Medical History, Home Medications, Labs, EKG , Allergies, Vital Signs/Nurse's Notes, Impression/Plan   Last Updated: 17-Jan-15 11:24 by Kathlyn Sacramento (MD)

## 2014-07-04 NOTE — H&P (Signed)
PATIENT NAME:  Isaac Collins, Isaac Collins MR#:  [id number] DATE OF BIRTH:  26-Aug-1940  DATE OF ADMISSION:  06/20/2013  REFERRING PHYSICIAN: Dr. Mariah Milling.   PRIMARY CARE PHYSICIAN: Dr. Marcello Fennel, [facility]   CHIEF COMPLAINT: Referred by Dr. Mariah Milling for direct admission for shortness of breath.   HISTORY OF PRESENT ILLNESS: The patient is a pleasant 74 year old African American male with a history of CKD stage IV, chronic obstructive pulmonary disease, chronic systolic CHF, EF of about 25%, nonischemic cardiomyopathy. He was referred by Dr. Mariah Milling for direct admission. Of note, the patient went in with complaint of shortness of breath. He is accompanied by his niece. Per niece, the patient's sister passed away in 2022/05/27 and the sister was one of the major factors in keeping him somewhat in check and making sure that he was taking his blood pressure medications, etc. Per niece, after mom's death, the patient had been basically on his own. She feels he had not been able to take care of himself medically as he should. As he was rolling into the CCU, had a [employer] sandwich and about 32-ounce drink from [employer]. Apparently he had stopped at [facility] before coming here, and the cup is almost empty.  He has been having shortness of breath per niece for the past month and, per him, worse today. He had gone to Dr. Mariah Milling who referred him in the hospital. Per Dr. Mariah Milling, he has gained about 10 to 15 pounds since last discharge. He has been having no chest pain, but increased lower extremity edema.   PAST MEDICAL HISTORY: Nonischemic cardiomyopathy since 2008, chronic systolic CHF, EF of about 25%, COPD, CKD stage IV.   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 No drug use. History of tobacco abuse.  PAST SURGICAL HISTORY: Denies.  MEDICATIONS:  Coreg 6.25 mg b.i.d., allopurinol 100 mg daily, torsemide 100 mg 2 times a day, digoxin 0.125 mg daily, isosorbide 30 mg daily. There are a couple of creams, i.e.,  ketoconazole 2% cream and hydrocortisone 2.5% cream, which he uses at times.  REVIEW OF SYSTEMS:  CONSTITUTIONAL: Positive for about 10 to 15 pound weight gain in the last several months. No fevers or chills.  EYES: No blurry vision or double vision.  ENT: No tinnitus or hearing loss. No sore throat.  RESPIRATORY: Positive for shortness of breath and dyspnea on exertion. No significant wheezing or cough.  CARDIOVASCULAR: No chest pain. Has worsening swelling in the legs. Has history of CHF.  GASTROINTESTINAL: No nausea, vomiting, diarrhea, abdominal pain, black or tarry stools or bloody stools.  GENITOURINARY: Denies dysuria, hematuria. HEMATOLOGIC AND LYMPHATIC: Denies anemia or easy bruising.  SKIN: No rashes.  MUSCULOSKELETAL: Denies significant arthritis.  NEUROLOGIC: No focal weakness or numbness. PSYCHIATRIC: Denies anxiety or insomnia.   PHYSICAL EXAMINATION: VITAL SIGNS: Temperature on arrival 96.7, pulse rate 111, respiratory rate 24. Initial blood pressure 97/78. O2 sat 88% on 4 liters. Sometimes it does go down to 60s, but at those times I do not see clearly uniform waveforms.  GENERAL: The patient is an elderly male lying in bed, a little tachypneic. HEENT: Normocephalic, atraumatic. Pupils are equal, muddy sclerae. Moist mucous membranes.  NECK: Supple. No thyroid tenderness. Positive for positive for JVD and hepatojugular reflux.  CARDIOVASCULAR: S1, S2, tachycardic and also the patient has S3 gallop.  LUNGS: Bilateral scattered crackles.  ABDOMEN: Soft, nontender, nondistended. Positive bowel sounds in all quadrants.  EXTREMITIES: The patient has 2+ pitting edema to below the knees and  also has extensive thigh edema.  NEUROLOGIC: Cranial nerves II through XII appears to be grossly intact. Strength is 5 out of 5 in all extremities. Sensation intact to light touch.  PSYCHIATRIC: Awake, alert, oriented, pleasant, cooperative.   LABORATORY AND IMAGING DATA: We do not have any  labs in our labs.  EKG and x-rays with an ABG has been ordered. Blood glucose is 118.   ASSESSMENT AND PLAN: We have a pleasant 74 year old male with a history of chronic kidney disease stage IV, chronic systolic congestive heart failure, nonischemic cardiomyopathy here for what appears to be acute hypoxic respiratory failure due to acute on chronic systolic congestive heart failure. Although I do not have any labs or imaging, the patient denies having any significant fevers or significant productive cough to suggest he is having a pneumonia. Therefore, at this point, I suspect acute on chronic congestive heart failure with S3 gallop and distended neck veins and a jugular venous distention is more likely. I have discussed the case already with Dr. Mariah Milling who referred him to the Intensive Care Unit. Would start a Lasix drip and a dobutamine drip and I would obtain a cardiology and nephrology consult. It would be a difficult situation with Lasix given his renal dysfunction and I do not have any labs recently. I would obtain an ABG given his hypoxemia to assess the level of hypoxemia, but at this point per initial vitals he appears to have tachypnea and oxygen saturation of 88% by pulse oximetry on 4 liters; plus he does require oxygen at this time. We will see the level of his kidney function and adjust the Lasix drip accordingly. He is a little tachycardic as well, possibly being driven by hypoxemia and acute respiratory failure and I do not know how he will behave with dobutamine. Would monitor his electrolytes. Would continue digoxin and obtain a digoxin level. We would hold the Coreg for the decompensated acute congestive heart failure at this point. He is not on ACE inhibitor, which I suspect is secondary to his kidney disease. Would hold torsemide and order low-dose morphine p.r.n. Would start him on heparin for deep vein thrombosis prophylaxis. Per him, he is DNR. We would follow with labs which we have  ordered, including cardiac panels, BNP, ABG, electrolyte panel. A PICC line has been ordered as he appears to be a very tough blood draw candidate. Would order an echocardiogram as well for assessing his cardiac function and a dietary consult as I suspect that his acute on chronic systolic congestive heart failure is likely driven by poor dietary choices. He came in with [employer] sandwich and likely at 32 ounce cup of soda which is almost empty. He was counseled by me about his dietary habits and I would do a dietary consult.  Total critical care time spent:  50 minutes.   ____________________________ Krystal Eaton, MD sa:ce D: 06/20/2013 12:56:17 ET T: 06/20/2013 13:19:08 ET JOB#: [id number]  cc: Krystal Eaton, MD, <Dictator> Barbette Reichmann, MD Antonieta Iba, MD Krystal Eaton MD ELECTRONICALLY SIGNED 07/10/2013 14:29

## 2014-07-04 NOTE — Consult Note (Signed)
No Known Allergies:    Plan Patient seen in the office today in decompensated CHF (acute on chronic combined/nonischemic cardiomyopathy). 25 lbs fluid overloaded. Also w/ stage IV CKD. Office note in chart. Direct-admitted to CCU for IV diuresis (Lasix drip) and dopamine gtt. Will place order for the latter. Discussed w/ Dr. Mariah Milling.   Electronic Signatures: Gery Pray (PA-C)   (Signed 10-Apr-15 14:34)  Authored: Impression/Plan, Allergies, Home Medications Julien Nordmann (MD)   (Signed 11-Apr-15 10:35)  Co-Signer: Impression/Plan, Allergies, Home Medications  Last Updated: 11-Apr-15 10:35 by Julien Nordmann (MD)

## 2014-07-04 NOTE — Consult Note (Signed)
General Aspect Isaac Collins is a very pleasant 75 year old gentleman with a history of nonischemic cardiomyopathy, history of severe congestive heart failure EF <25% who was placed on hospice in 2008 who had been doing very well since that time on aggressive medical management and diuresis (came off hospice).   However, he had significnat deterioration over the last 6 months with multiple hospitalizations for CHF most recently was last month and required Dobutamine and Dopamine.  period As this was 2 weeks ago. He called EMS for dyspnea and weakness. He was found naked and confused. He had recurrent NSVT and was started on Lidocaine drip. He is currently lethargic and not responsive.   Present Illness . FAMILY HISTORY: Family history is negative for early coronary artery disease or hypertension. Admits to patient's sister having cerebrovascular accident as well as congestive heart failure. The patient's mother died of unknown cancer at age 40. The patient's father died from motor vehicle accident.  SOCIAL HISTORY: The patient is single. He does not have any children.  He used to smoke approximately one-half pack per day since approximately ten years ago. He denies any alcohol abuse recently. He used to socially drink in the past. 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   Physical Exam:  GEN thin, critically ill appearing, cold and clammy   HEENT hearing intact to voice, dry oral mucosa   NECK supple   RESP normal resp effort  crackles   CARD Regular rate and rhythm  Tachycardic  S4  Murmur   Murmur Systolic   Systolic Murmur Out flow   ABD denies tenderness  soft   EXTR positive edema, woody edema of LE   SKIN normal to palpation   NEURO motor/sensory function intact   PSYCH lethargic   Review of Systems:  Subjective/Chief Complaint SOB and weaknedd   General: Weakness   Respiratory: Short of breath   Cardiovascular: Dyspnea   ROS Pt not able to  provide ROS   Home Medications: Medication Instructions Status  amiodarone 200 mg oral tablet 1 tab(s) orally  Active  Ensure Plus * 237 milliliter(s) orally 2 times a day Active  torsemide 100 mg oral tablet  orally bid Active  carvedilol 3.125 mg oral tablet 1 tab(s) orally 2 times a day Active  aspirin 81 mg oral tablet, chewable 1 tab(s) orally once a day Active  isosorbide mononitrate 30 mg oral tablet, extended release 1 tab(s) orally once a day Active  Hydrocort cream 2.5% topical cream Apply topically to affected area 2 times a day Active  ketoconazole topical 2% topical cream Apply topically to affected area 2 times a day Active  calcitriol 0.5 mcg oral capsule 1 cap(s) orally once a day Active   Lab Results: Routine Chem:  07-May-15 06:31   Glucose, Serum  57  BUN  106  Creatinine (comp)  4.31  Sodium, Serum  133  Potassium, Serum 4.6  Chloride, Serum 102  CO2, Serum  17  Calcium (Total), Serum 8.9  Anion Gap 14  Osmolality (calc) 297  eGFR (African American)  15  eGFR (Non-African American)  13 (eGFR values <35m/min/1.73 m2 may be an indication of chronic kidney disease (CKD). Calculated eGFR is useful in patients with stable renal function. The eGFR calculation will not be reliable in acutely ill patients when serum creatinine is changing rapidly. It is not useful in  patients on dialysis. The eGFR calculation may not be applicable to patients at the low  and high extremes of body sizes, pregnant women, and vegetarians.)  Result Comment POTASSIUM - Slight hemolysis, interpret results with  - caution.  Result(s) reported on 17 Jul 2013 at 09:18AM.  Cardiac:  07-May-15 06:31   Troponin I 0.03 (0.00-0.05 0.05 ng/mL or less: NEGATIVE  Repeat testing in 3-6 hrs  if clinically indicated. >0.05 ng/mL: POTENTIAL  MYOCARDIAL INJURY. Repeat  testing in 3-6 hrs if  clinically indicated. NOTE: An increase or decrease  of 30% or more on serial  testing suggests a   clinically important change)  Routine Hem:  07-May-15 08:53   WBC (CBC) 5.9  RBC (CBC) 5.50  Hemoglobin (CBC) 14.8  Hematocrit (CBC) 47.3  Platelet Count (CBC)  114  MCV 86  MCH 26.8  MCHC  31.2  RDW  19.7  Neutrophil % 71.4  Lymphocyte % 15.2  Monocyte % 12.3  Eosinophil % 0.2  Basophil % 0.9  Neutrophil # 4.2  Lymphocyte #  0.9  Monocyte # 0.7  Eosinophil # 0.0  Basophil # 0.1 (Result(s) reported on 17 Jul 2013 at 09:23AM.)   EKG:  Interpretation sinus tachycardia   EKG Comparision Not changed from    No Known Allergies:   Vital Signs/Nurse's Notes: **Vital Signs.:   07-May-15 08:00  Vital Signs Type Routine  Temperature Temperature (F) 97  Celsius 36.1  Pulse Pulse 116  Respirations Respirations 22  Systolic BP Systolic BP 93  Diastolic BP (mmHg) Diastolic BP (mmHg) 58  Mean BP 69  Pulse Ox % Pulse Ox % 90  Oxygen Delivery 3L  Pulse Ox Heart Rate 34    Impression 1) Acute on chronic systolic CHF, NYHA class 4 - with cardiogenic shock and evidence of severe hypoperfusion: I started Dobutamine drip and Dopamine to support BP.  Overall prognosis is poor. Agree with palliatve care.   Can consider diuresis once perfusion improves with the above.       2)  NSVT: I stopped Lidocaine which is relatively contraindicated with this degree of renal failure. Continue to monitor.       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 Treat as above.   Electronic Signatures: Kathlyn Sacramento (MD)  (Signed 07-May-15 12:11)  Authored: General Aspect/Present Illness, History and Physical Exam, Review of System, Home Medications, Labs, EKG , Allergies, Vital Signs/Nurse's Notes, Impression/Plan   Last Updated: 07-May-15 12:11 by Kathlyn Sacramento (MD)

## 2014-07-04 NOTE — Discharge Summary (Signed)
PATIENT NAME:  Isaac Collins, Isaac Collins MR#:  [id number] DATE OF BIRTH:  1940-09-11  DATE OF ADMISSION:  06/20/2013 DATE OF DISCHARGE:  06/27/2013  DIAGNOSES AT TIME OF DISCHARGE: 1.  Acute on chronic systolic congestive heart failure with nonischemic cardiomyopathy and hypoxemic respiratory failure.  2.  Acute on chronic kidney disease.  3.  Chronic obstructive pulmonary disease.   CHIEF COMPLAINT: Shortness of breath, increasing edema.   HISTORY OF PRESENT ILLNESS: Isaac Collins is a 74 year old African American male with history of stage 4 CKD, chronic obstructive pulmonary disease, chronic systolic congestive heart failure, ejection fraction 25%, nonischemic cardiomyopathy, who is referred by Dr. Mariah Milling as a direct admission. The patient had been complaining of progressive shortness of breath and reportedly had not been able to take care of himself at home and had been having shortness of breath worse in the last 1 month or so. The patient had also gained about 10 to 15 pounds of weight since his last discharge. He denied any chest pain but had been having increased lower extremity edema.   PAST MEDICAL HISTORY: Significant fir nonischemic cardiomyopathy since 2008, chronic systolic congestive heart failure with ejection fraction of 25%, chronic obstructive pulmonary disease, CKD stage 4, history of alcohol use in the past and history of tobacco use.   PHYSICAL EXAMINATION: VITAL SIGNS: Temperature 96.7, pulse was 111, respirations 24, blood pressure 97/78, oxygen saturation 88% on 4 liters nasal cannula.  GENERAL:  He was mildly tachypneic at rest.  EYES:  Pupils equal and reactive to light.  NECK: Supple.  HEART: S1, S2, tachycardic with an S3 gallop.  LUNGS: Bilateral inspiratory and expiratory crackles.  ABDOMEN: Soft, nondistended.  EXTREMITIES: Have 2+ edema with scrotal edema as well.  NEUROLOGIC: Nonfocal.   HOSPITAL COURSE: The patient was admitted initially to the Critical Care Unit  and he was given Lasix drip and also dobutamine drip. Both cardiology and nephrology were consulted. During his stay in the hospital, the patient also received a PICC line and diuresed well. He was subsequently started on oral torsemide and blood pressure was low but the patient's overall condition improved significantly. His labs did show an elevated BNP of 26,090. Initial creatinine was 3.42 but gradually improved to about 2.64. His albumin was also low at 2.5. Troponin was negative. Serum dig level was 0.44. TSH was 4.11. Chest x-ray showed improving congestive heart failure with pulmonary interstitial edema, tiny right pleural effusion. I discussed his case with Dr. Mariah Milling, who felt the patient would benefit from amiodarone because of nonsustained VT and he was discharged in stable condition on the following medications.   DISCHARGE MEDICATIONS: Isosorbide mononitrate 30 mg 1 tab once a day, carvedilol 3.125 mg b.i.d., torsemide 100 mg b.i.d., aspirin 81 mg a day, KCl 20 mEq once a day, Ensure Plus 1 can twice a day, amiodarone 200 mg once a day and hydrocortisone cream 2.5% apply to affected area b.i.d.   DISCHARGE INSTRUCTIONS: The patient was advised to follow up with Dr. Mariah Milling in 1 to 2 weeks, also follow up with Dr. Marcello Fennel and also follow up with Dr. Thedore Mins, nephrologist, in 1 to 2 weeks' time. Physical therapy and home health was also arranged for him and he has been advised to report to the ER if he develops worsening shortness of at any time. The patient was stable at the time of discharge. His PICC line was removed and he was given strict guidelines as far as diet and also advised to follow up  with respective physicians.   TIME SPENT: Total Time Spent in discharge the patient 40 minutes.   ____________________________ Barbette Reichmann, MD vh:cs D: 06/30/2013 18:45:22 ET T: 06/30/2013 20:18:35 ET JOB#: [id number]  cc: Barbette Reichmann, MD, <Dictator> Barbette Reichmann MD ELECTRONICALLY SIGNED  07/29/2013 13:22

## 2014-07-04 NOTE — Consult Note (Signed)
Chief Complaint:  Subjective/Chief Complaint He is awake and reports that his breathing is back to baseline.   VITAL SIGNS/ANCILLARY NOTES: **Vital Signs.:   10-May-15 13:00  Vital Signs Type Routine  Pulse Pulse 114  Respirations Respirations 21  Systolic BP Systolic BP 80  Diastolic BP (mmHg) Diastolic BP (mmHg) 60  Mean BP 66  Pulse Ox % Pulse Ox % 56  Pulse Ox Activity Level  At rest  Oxygen Delivery 4L; Nasal Cannula  Pulse Ox Heart Rate 128  *Intake and Output.:   Shift 10-May-15 15:00  Grand Totals Intake:  778.8 Output:  160    Net:  618.8 24 Hr.:  618.8  Oral Intake      In:  720  IV (Primary)      In:  28.8  IV (Primary)      In:  30  Urine ml     Out:  160  Length of Stay Totals Intake:  2024.6 Output:  1285    Net:  739.6   Brief Assessment:  GEN critically ill appearing   Cardiac + LE edema  + JVD   Respiratory no use of accessory muscles  decreased breath sounds   Gastrointestinal details normal Soft  Mildly distended   EXTR positive edema   Lab Results: Routine Chem:  10-May-15 04:40   Glucose, Serum  121  BUN  110  Creatinine (comp)  4.25  Sodium, Serum  130  Potassium, Serum 4.0  Chloride, Serum  96  CO2, Serum 21  Calcium (Total), Serum  8.2  Anion Gap 13  Osmolality (calc) 297  eGFR (African American)  15  eGFR (Non-African American)  13 (eGFR values <45m/min/1.73 m2 may be an indication of chronic kidney disease (CKD). Calculated eGFR is useful in patients with stable renal function. The eGFR calculation will not be reliable in acutely ill patients when serum creatinine is changing rapidly. It is not useful in  patients on dialysis. The eGFR calculation may not be applicable to patients at the low and high extremes of body sizes, pregnant women, and vegetarians.)  Result Comment LABS - This specimen was collected through an   - indwelling catheter or arterial line.  - A minimum of 578m of blood was wasted prior    - to  collecting the sample.  Interpret  - results with caution.  Result(s) reported on 20 Jul 2013 at 05:19AM.  Routine Hem:  10-May-15 04:40   Platelet Count (CBC)  88 (Result(s) reported on 20 Jul 2013 at 05:17AM.)   Assessment/Plan:  Assessment/Plan:  Plan CHF:  No progress.  Continued low output shock.  Increase Lasix to 10 mg/hr.  However,  prognosis is very poor.  He is doing OK off the the dopamine.  Continue dobutamine.   Electronic Signatures: HoMinus BreedingMD)  (Signed 10-May-15 14:25)  Authored: Chief Complaint, VITAL SIGNS/ANCILLARY NOTES, Brief Assessment, Lab Results, Assessment/Plan   Last Updated: 10-May-15 14:25 by HoMinus BreedingMD)

## 2014-07-04 NOTE — H&P (Signed)
PATIENT NAME:  Isaac Collins, Isaac Collins MR#:  [id number] DATE OF BIRTH:  02-16-1941  DATE OF ADMISSION:  03/28/2013  PRIMARY CARE PHYSICIAN: Ashok Norris, MD  EMERGENCY ROOM PHYSICIAN: Les Pou, MD  CHIEF COMPLAINT: Shortness of breath and weight gain.  HISTORY OF PRESENT ILLNESS: The patient is a 74 year old male patient with history of systolic heart failure and also chronic kidney disease stage III brought in because of shortness of breath and weight gain. The patient sent from cardiology office because of persistent weight gain of 16 pounds over 2 weeks. The patient was given extra dose of torsemide. The patient took 80 mg of torsemide on Wednesday evening and Thursday morning and since yesterday night he took 120 mg of torsemide yesterday night and this morning. Because the patient still has shortness of breath and pedal edema, the patient was sent in here for further evaluation. The patient denies any chest pain. Has been having shortness of breath and weight gain because of fluid. The patient was seen in [facility]. Has history of systolic heart failure and also history of hypertension. The patient's creatinine today is 3.11, BNP 25,000, and the patient is going to be admitted to medical service on telemetry for CHF exacerbation with acute on chronic renal failure. Discussed with Dr. Juleen China. We are going to start him on Lasix drip and monitor kidney function and see how he does. The patient's notes reviewed from cardiology office. The patient was given an extra dose of torsemide but he is not having enough urine output and despite taking extra dose of torsemide the patient has not been having enough urine output. That is according to the notes from Dr. Tyrell Antonio office.  PAST MEDICAL HISTORY:  1.  Systolic heart failure, NYHA class III. 2.  Chronic renal insufficiency, stage IV. 3.  COPD.  4.  EF of 25% and the patient was at hospice in 2000, but was discharged because he was doing well.   5.  Long-standing nonischemic cardiomyopathy since 2008 and he was at [facility] at one point requiring dobutamine and also aggressive diuresis.   SOCIAL HISTORY: Significant for history of major alcohol abuse and now quit. The patient has a history of smoking and smokes 1 to 2 cigarettes a day.   PAST SURGICAL HISTORY: None.   MEDICATIONS: Allopurinol 300 mg p.o. daily, Coreg 12.5 mg p.o. b.i.d., ibuprofen 400 mg every 6 hours as needed, and torsemide. The patient is taking 80 mg on Wednesday and he started to take 120 mg last night and also this morning. He is also on hydralazine 10 mg half tablet by mouth b.i.d. and he is on Aldactone 25 mg by mouth daily.   REVIEW OF SYSTEMS:  CONSTITUTIONAL: Has no fever. Does have fatigue. EYES: No blurred vision.  ENT: No tinnitus. No ear pain. No epistaxis. No difficulty swallowing.  RESPIRATORY: The patient has shortness of breath and dyspnea on exertion.  CARDIOVASCULAR: No chest pain. Does have pedal edema.  GASTROINTESTINAL: No nausea. No vomiting. Does have ascites.  GENITOURINARY: The patient has no dysuria.  ENDOCRINE: No polyuria or nocturia.  INTEGUMENT: The patient does have weeping blisters on the lower extremities.  NEUROLOGIC: No numbness or weakness.  PSYCHIATRIC: No anxiety or insomnia.   PHYSICAL EXAMINATION: VITAL SIGNS: BMI 20.8. Heart rate is 120, blood pressure 102/77, sats 97% on room air, and temperature 97.9.  GENERAL: The patient is a very cachetic male, alert and cooperative. Denies any complaints except shortness of breath and pedal edema.  NECK: Supple. JVD several centimeter above the notch. No carotid bruit. Trachea is in the midline.  LUNGS: The patient has basilar crepitations present, otherwise clear to auscultation.  CARDIOVASCULAR: S1, S2 irregular. The patient has prominent apical pulse and has 2/6 systolic murmur present at the left sternal border. No diastolic murmur.  ABDOMEN: Mildly distended and also has  some ascites. The patient has bowel sounds present. No organomegaly. No hernias. EXTREMITIES: The patient has 3+ edema up to knees and also has some venous stasis dermatitis present and pitting edema. Pulses are very faint, distal pulses because of edema.  SKIN: Dry and scaly.  NEUROLOGIC: Cranial nerves II through XII intact. Power 5/5 in upper and lower extremities. Sensation intact. DTRs 2+ bilaterally.  PSYCHIATRIC: Mood and affect are within normal limits.   LABORATORY AND DIAGNOSTICS: Troponin less than 0.02. Chest x-ray shows cardiomegaly without evidence of pulmonary edema, small effusions. No infiltrates. BNP 2586. WBC 7.7, hemoglobin 13.3, hematocrit 41, platelets 153. Electrolytes: Sodium 139, potassium 4.1, chloride 104, bicarb 25, BUN 66, creatinine 3.11, glucose 111. GFR 19. EKG showed sinus tachycardia with some PVCs and 114 beats per minute.   ASSESSMENT AND PLAN:  1.  This is a 74 year old male with long-standing history of dilated cardiomyopathy with acute on chronic systolic heart failure with refractory weight gain. The patient is not responding well to the increased dose of Demadex. The patient is going to be admitted to hospitalist service on telemetry. Discussed this with Dr. Juleen China. We are going to try the  start Lasix drip at 10 mg per hour and IV Lasix 80 mg IV push given and see how he does. Monitor MET-B very closely and kidney function closely and monitor urine output. The patient will get a Foley catheter to monitor urine output and hold his Aldactone and lisinopril at this time.  2.  Nonischemic cardiomyopathy with tachycardia. The patient is already on Coreg and digoxin. Continue them and monitor him on telemetry. The patient has non-ischemic cardiomyopathy with acute on chronic systolic heart failure and worsening renal failure. The patient's creatinine 2 years ago was around 2. The patient did not see a nephrologist before despite being advised to.   The patient's  condition is critical. He has multiple comorbid conditions with non-ischemic cardiomyopathy, ejection fraction of 25%, and also acute on chronic renal failure, poor overall status. The patient is at risk for cardiac arrest.   CRITICAL CARE TIME SPENT ON HISTORY AND PHYSICAL: About 60 minutes. ____________________________ Epifanio Lesches, MD sk:sb D: 03/28/2013 15:59:14 ET T: 03/28/2013 16:30:06 ET JOB#: [id number]  cc: Epifanio Lesches, MD, <Dictator> Epifanio Lesches MD ELECTRONICALLY SIGNED 04/21/2013 16:31

## 2014-07-04 NOTE — H&P (Signed)
PATIENT NAME:  Isaac Collins, Isaac Collins MR#:  [id number] DATE OF BIRTH:  03/07/1941  DATE OF ADMISSION:  07/17/2013  REFERRING PHYSICIAN:  Dr. Jasmine December.   PRIMARY CARE PHYSICIAN:  Dr. Ginette Pitman, [facility].   CARDIOLOGIST:  Dr. Rockey Situ.   CHIEF COMPLAINT:  Weakness, shortness of breath.   HISTORY OF PRESENT ILLNESS:  A 74 year old African American gentleman with history of COPD as well as congestive heart failure, EF 25%, presenting with weakness, describes generalized weakness three weeks duration with associated shortness of breath.  He describes shortness of breath mainly as dyspnea on exertion with associated orthopnea and lower extremity edema which has been progressively worsening.  Denies any chest pain, cough or further symptomatology.  On EMS arrival, he was apparently found sitting naked on his couch and noted to be hypotensive with systolic blood pressures in the 60s.  Throughout EMS transport as well as on initial arrival to the ER noted to have repeated bouts of nonsustained ventricular tachycardia.  He was subsequently started on a lidocaine drip, currently complaining only of generalized weakness.   REVIEW OF SYSTEMS:   I question the reliability of this information, however:    CONSTITUTIONAL:  Positive for fatigue, weakness.  Denies fevers, chills.  EYES:  Denies blurred vision, double vision, eye pain.  EARS, NOSE, THROAT:  Denies tinnitus, ear pain, hearing loss.  RESPIRATORY:  Denies cough, wheeze.  Positive for shortness of breath.  CARDIOVASCULAR:  Denies chest pain, palpitations.  Positive for edema.  GASTROINTESTINAL:  Denies nausea, vomiting, diarrhea or abdominal pain.  GENITOURINARY:  Denies dysuria, hematuria.  ENDOCRINE:  Denies nocturia or thyroid problems.  HEMATOLOGIC AND LYMPHATIC:  Denies easy bruising, bleeding.  SKIN:  Denies rash or lesions.  MUSCULOSKELETAL:  Denies pain in neck, back, shoulder, knees, hips or arthritic symptoms.  NEUROLOGIC:  Denies paralysis,  paresthesias.  PSYCHIATRIC:  Denies anxiety or depressive symptoms.  Otherwise, full review of systems performed by me is negative.   PAST MEDICAL HISTORY:  Chronic kidney disease with apparent baseline creatinine between 2 and 3, COPD as well as systolic congestive heart failure which has been deemed non-ischemic, most likely secondary to alcohol.   SOCIAL HISTORY:  Remote alcohol use.  Occasional tobacco usage.  No drug usage.   FAMILY HISTORY:  No documented history of cardiovascular illnesses.   ALLERGIES:  No known drug allergies.   HOME MEDICATIONS:  Include aspirin 81 mg by mouth daily, Imdur 30 mg by mouth daily, amiodarone 200 mg by mouth daily, Coreg 3.125 mg by mouth twice daily, hydrocortisone cream 2.5% topical to affected area twice daily, ketoconazole 2% topical cream apply to affected area twice daily, torsemide 100 mg by mouth twice daily, calcitriol 0.5 mcg by mouth daily, Ensure 237 mL twice daily.   PHYSICAL EXAMINATION: VITAL SIGNS:  Temperature 97.5, heart rate 109, respirations 18.  Initial blood pressure that is documented is 72/38, currently 105/76, saturating 95% on room air.  Weight 61.2 kg.  BMI 20.  GENERAL:  Chronically ill-appearing African American gentleman in minimal distress.  HEAD:  Normocephalic, atraumatic.  EYES:  Pupils equal, round, reactive to light.  Extraocular muscles intact.  No scleral icterus.  MOUTH:  Moist mucosal membrane.  Dentition intact.  No abscess noted.  EAR, NOSE, THROAT:  Throat clear without exudates.  No external lesions.  NECK:  Supple.  No thyromegaly.  No nodules.  Positive JVD.  PULMONARY:  Diffuse coarse breath sounds bilaterally.  No use of accessory muscles.  Good respiratory effort.  CHEST:  Nontender to palpation.  CARDIOVASCULAR:  S1, S2, tachycardic.  No murmurs, rubs, or gallops.  He has 1+ edema throughout the entirety of lower extremities.  Right lower extremity has flaccid bullae.  Pedal pulses 2+ bilaterally.   GENITOURINARY:  Has scrotal edema. GASTROINTESTINAL:  Soft, nontender, nondistended.  No masses.  Positive bowel sounds.  No hepatosplenomegaly.  MUSCULOSKELETAL:  No swelling or clubbing.  Positive for edema as described above.  Range of motion full in all extremities.  NEUROLOGIC:  Cranial nerves II through XII intact.  No gross focal neurological deficits.  Sensation intact.  Reflexes intact. SKIN:  No ulcerations, lesions, rash, cyanosis other than described above.  No cyanosis.  Skin warm and dry.  Turgor intact.  PSYCHIATRIC:  Mood and affect blunted.  He is sleepy, but easily arousable and oriented x 3.  Insight and judgment seem to be poor.   LABORATORY DATA:  EKG revealing sinus tachycardia, heart rate of 112 with evidence of left atrial enlargement indicated by inverted T waves.  Remainder of laboratory data:  Sodium 128, potassium 6.4, chloride 97, bicarb 21, BUN 100, creatinine of 4.3, glucose 109.  BNP 30,627.  LFTs:  Albumin 2.7, bilirubin 3, alk phos 163, AST 67, WBC 5.8, hemoglobin 14.2, platelets of 119.  Urinalysis negative for evidence of infection.  Lactic acid of 3.3.  Chest x-ray performed revealing cardiomegaly.   ASSESSMENT AND PLAN:  A 74 year old gentleman with history of chronic obstructive pulmonary disease as well as congestive heart failure, ejection fraction of 25%, complaining of weakness, noted to have repeated bouts of nonsustained ventricular tachycardia in the Emergency Department.  1.  Acute on chronic systolic congestive heart failure, place on telemetry, Lasix, Coreg as blood pressure tolerates.  DuoNeb treatments.  Incentive spirometry.  Currently, his blood pressure is improved.  However, if worsens may require dobutamine at 0.5 mcg/kg per minute to increase cardiac output which may be titrated; however with already receiving ventricular activity would not go any higher dosing.  May also require the addition of Levophed if blood pressure remains low.  2.   Hyperkalemia.  Insulin and dextrose now as well as intravenous calcium and Kayexalate.  We will recheck basic metabolic panel after medications given in 6 hours.  3.  Lactic acidosis due to poor cardiac output.  We will attempt to maintain mean arterial pressure greater than 65.  4.  Acute kidney injury on chronic kidney disease.  Diuresis as tolerated in hopes to increase cardiac output; however, we will need to monitor urine output and renal function closely.  5.  Nonsustained ventricular tachycardia.  Place on lidocaine drip.  Continue this.  Cardiology consult.  Correct electrolytes including potassium.  6.  Venous thromboembolism prophylaxis with heparin subQ.  7.  CODE STATUS:  THE PATIENT IS A FULL CODE.   Critical care time spent 45 minutes.    ____________________________ Aaron Mose. Hower, MD dkh:ea D: 07/17/2013 02:53:34 ET T: 07/17/2013 04:21:02 ET JOB#: [id number]  cc: Aaron Mose. Hower, MD, <Dictator> DAVID Woodfin Ganja MD ELECTRONICALLY SIGNED 07/18/2013 1:02

## 2014-07-04 NOTE — Discharge Summary (Signed)
PATIENT NAME:  Isaac Collins, Isaac Collins MR#:  [id number] DATE OF BIRTH:  [birth year]  DATE OF ADMISSION:  07/17/2013 DATE OF DISCHARGE: 07/21/2013   DIAGNOSES AT TIME OF DISCHARGE:  1.  Acute on chronic systolic congestive heart failure with cardiogenic shock and severe hypoperfusion.  2.  Acute on chronic renal failure.  3.  Nonsustained ventricular tachycardia.   CHIEF COMPLAINT: Weakness and shortness of breath.  HISTORY OF PRESENT ILLNESS: Isaac Collins is a 74 year old African American male with a history of COPD, CHF, ejection fraction of 25%, who presented initially with weakness associated with shortness of breath, orthopnea and lower extremity edema. The patient reportedly was found naked on his couch at home with systolic blood pressure in the 60s. He also was noted to have recurrent episodes of nonsustained VT. He was initially started on a lidocaine drip that subsequently was switched to amiodarone. The patient was also hypotensive and needed dopamine and dobutamine and dopamine was weaned off. He was also seen by a nephrologist and started on Lasix drip.   PHYSICAL EXAMINATION:  VITAL SIGNS: Temperature was 97.5, heart rate was 109, respirations 18, blood pressure initially was 72/38, subsequently improved to 105/76, oxygen saturation 95% on room air, weight 61.2.  GENERAL: He was critically ill-appearing.  HEENT: NCAT. Pupils equal and reactive to light.  NECK: Supple.  LUNGS: Diffuse coarse breath sounds bilaterally.  HEART: S1, S2, tachycardic. 1+ edema with scrotal edema as well.  NEUROLOGIC: Nonfocal.   INITIAL LABS: EKG revealed sinus tachycardia, heart rate 112. Sodium 128, potassium 6.4, chloride 97, bicarbonate 21, BUN 100, creatinine 4.3, glucose 109. BNP 30,627. Albumin 2.7, alkaline phosphatase 163, AST 67. WBC count 5.8, hemoglobin 14.2, platelets 119. Chest x-ray revealed evidence of cardiomegaly.   HOSPITAL COURSE: The patient was admitted the CCU and received dobutamine,  dopamine and also received Kayexalate for hyperkalemia. He was seen by a nephrologist and started on Lasix drip. He was switched from lidocaine to amiodarone and he was maintained in sinus rhythm. Discussions were held with the family about his overall poor prognosis and after discussions with his power of attorney, he was made limited code with no CPR, no intubation and no mechanical ventilation, but yes with vasoactive drugs and antiarrhythmic agents. The patient's labs showed a hemoglobin of 12.9, WBC count 8.6, platelet count of 86. Glucose 121, BUN 111, creatinine 4.29, sodium 128, potassium 4.3, chloride 94, CO2 21, EGFR was 15. His cardiac enzymes were negative. The patient was also seen by palliative care, Dr. Ermalinda Memos and the patient's niece, who is his POA, Isaac Collins wanted him to be transferred to [facility] in [place]. The patient was discharged on the following medications.   DISCHARGE MEDICATIONS: Tylenol 325 mg 650 q.4 hours p.r.n., aspirin 81 mg a day, amiodarone 4 mg once a day, morphine 2 to 4 mg IV push q.4-6h. p.r.n., heparin 5000 units subcu q.8 hours, tramadol 50 mg q.6 hours p.r.n. The patient was also on a dobutamine drip.   The patient was discharged to [facility] select in [place] in critical, but stable condition.  TOTAL TIME SPENT IN DISCHARGING THIS PATIENT: 45 minutes.  ____________________________ Tracie Harrier, MD vh:aw D: 07/21/2013 13:33:03 ET T: 07/21/2013 13:55:13 ET JOB#: [id number]  cc: Tracie Harrier, MD, <Dictator> Tracie Harrier MD ELECTRONICALLY SIGNED 07/29/2013 13:29

## 2014-07-04 NOTE — Consult Note (Signed)
Chief Complaint:  Subjective/Chief Complaint Acute on chronic heart failure:  He is somonlent and still SOB.   VITAL SIGNS/ANCILLARY NOTES: **Vital Signs.:   09-May-15 00:00  Vital Signs Type Routine  Pulse Pulse 116  Respirations Respirations 17  Systolic BP Systolic BP 90  Diastolic BP (mmHg) Diastolic BP (mmHg) 68  Mean BP 75  Pulse Ox % Pulse Ox % 95  Oxygen Delivery 4L; Nasal Cannula  Pulse Ox Heart Rate 204    01:00  Vital Signs Type Routine  Pulse Pulse 112  Respirations Respirations 19  Systolic BP Systolic BP 79  Diastolic BP (mmHg) Diastolic BP (mmHg) 60  Mean BP 66  Pulse Ox % Pulse Ox % 95  Oxygen Delivery 4L; Nasal Cannula  Pulse Ox Heart Rate 204    02:00  Vital Signs Type Routine  Pulse Pulse 110  Respirations Respirations 20  Systolic BP Systolic BP 94  Diastolic BP (mmHg) Diastolic BP (mmHg) 66  Mean BP 75  Pulse Ox % Pulse Ox % 95  Oxygen Delivery 4L; Nasal Cannula  Pulse Ox Heart Rate 204    03:00  Vital Signs Type Routine  Pulse Pulse 56  Respirations Respirations 18  Systolic BP Systolic BP 88  Diastolic BP (mmHg) Diastolic BP (mmHg) 68  Mean BP 74  Pulse Ox % Pulse Ox % 69  Oxygen Delivery 4L; Nasal Cannula  Pulse Ox Heart Rate 60    04:00  Vital Signs Type Routine  Pulse Pulse 110  Respirations Respirations 17  Systolic BP Systolic BP 87  Diastolic BP (mmHg) Diastolic BP (mmHg) 65  Mean BP 72  Pulse Ox % Pulse Ox % 92  Oxygen Delivery 4L; Nasal Cannula  Pulse Ox Heart Rate 54    05:00  Vital Signs Type Routine  Temperature Temperature (F) 97.3  Celsius 36.2  Temperature Source oral  Pulse Pulse 108  Respirations Respirations 16  Systolic BP Systolic BP 95  Diastolic BP (mmHg) Diastolic BP (mmHg) 60  Mean BP 71  Pulse Ox % Pulse Ox % 92  Oxygen Delivery 4L; Nasal Cannula  Pulse Ox Heart Rate 54    06:00  Vital Signs Type Routine  Pulse Pulse 108  Respirations Respirations 29  Systolic BP Systolic BP 88  Diastolic BP  (mmHg) Diastolic BP (mmHg) 55  Mean BP 66  Pulse Ox % Pulse Ox % 41  Oxygen Delivery 4L; Nasal Cannula  Pulse Ox Heart Rate 106    07:00  Pulse Pulse 112  Respirations Respirations 20  Pulse Ox % Pulse Ox % 98  Pulse Ox Heart Rate 112    08:00  Vital Signs Type Routine  Temperature Temperature (F) 96.8  Celsius 36  Temperature Source axillary  Pulse Pulse 110  Respirations Respirations 19  Systolic BP Systolic BP 89  Diastolic BP (mmHg) Diastolic BP (mmHg) 53  Mean BP 65  Pulse Ox % Pulse Ox % 76  Pulse Ox Activity Level  At rest  Oxygen Delivery 4L; Nasal Cannula  Pulse Ox Heart Rate 110    09:00  Vital Signs Type Routine  Pulse Pulse 112  Respirations Respirations 25  Systolic BP Systolic BP 91  Diastolic BP (mmHg) Diastolic BP (mmHg) 63  Mean BP 72  Pulse Ox % Pulse Ox % 94  Pulse Ox Heart Rate 104    10:00  Vital Signs Type Routine    14:00  Vital Signs Type Routine  Pulse Pulse 118  Respirations Respirations 13  Systolic  BP Systolic BP 84  Diastolic BP (mmHg) Diastolic BP (mmHg) 53  Mean BP 63  Pulse Ox % Pulse Ox % 92  Pulse Ox Heart Rate 114   Brief Assessment:  GEN cachectic   Cardiac JVD to jaw   Gastrointestinal details normal Distended   EXTR positive edema   Lab Results: Routine Chem:  09-May-15 04:42   Glucose, Serum  123  BUN  105  Creatinine (comp)  4.18  Sodium, Serum  132  Potassium, Serum 4.1  Chloride, Serum 99  CO2, Serum  17  Calcium (Total), Serum  8.0  Anion Gap 16  Osmolality (calc) 299  eGFR (African American)  16  eGFR (Non-African American)  13 (eGFR values <96m/min/1.73 m2 may be an indication of chronic kidney disease (CKD). Calculated eGFR is useful in patients with stable renal function. The eGFR calculation will not be reliable in acutely ill patients when serum creatinine is changing rapidly. It is not useful in  patients on dialysis. The eGFR calculation may not be applicable to patients at the low and high  extremes of body sizes, pregnant women, and vegetarians.)  Routine Hem:  09-May-15 04:42   WBC (CBC) 5.9  RBC (CBC) 4.97  Hemoglobin (CBC) 13.1  Hematocrit (CBC) 41.5  Platelet Count (CBC)  81  MCV 83  MCH 26.4  MCHC  31.6  RDW  18.8  Neutrophil % 82.8  Lymphocyte % 8.0  Monocyte % 8.4  Eosinophil % 0.2  Basophil % 0.6  Neutrophil # 4.9  Lymphocyte #  0.5  Monocyte # 0.5  Eosinophil # 0.0  Basophil # 0.0 (Result(s) reported on 19 Jul 2013 at 05:57AM.)   Assessment/Plan:  Assessment/Plan:  Assessment CHF:  End stage.  There is really not anything to add that will change the outcome.  He is in low output shock and not repsonding to maximal medical therapy.  I agree with attempts at IV diuresis with continuous Lasix drip which we might be able to titrate.  However, hospice care discussions are appropriate.  Continuing dopamine and dobutamine.  I think that SBP of 80 is a reasonable goal trying to avoid sinus tachycardia.   Electronic Signatures: HMinus Breeding(MD)  (Signed 09-May-15 14:34)  Authored: Chief Complaint, VITAL SIGNS/ANCILLARY NOTES, Brief Assessment, Lab Results, Assessment/Plan   Last Updated: 09-May-15 14:34 by HMinus Breeding(MD)

## 2015-09-30 IMAGING — CR DG CHEST 1V PORT
1 series · 1 of 1 positions shown · non-contrast
Comparison: 03/28/2013.

CLINICAL DATA: Shortness of breath.

EXAM:
PORTABLE CHEST - 1 VIEW

[ap]
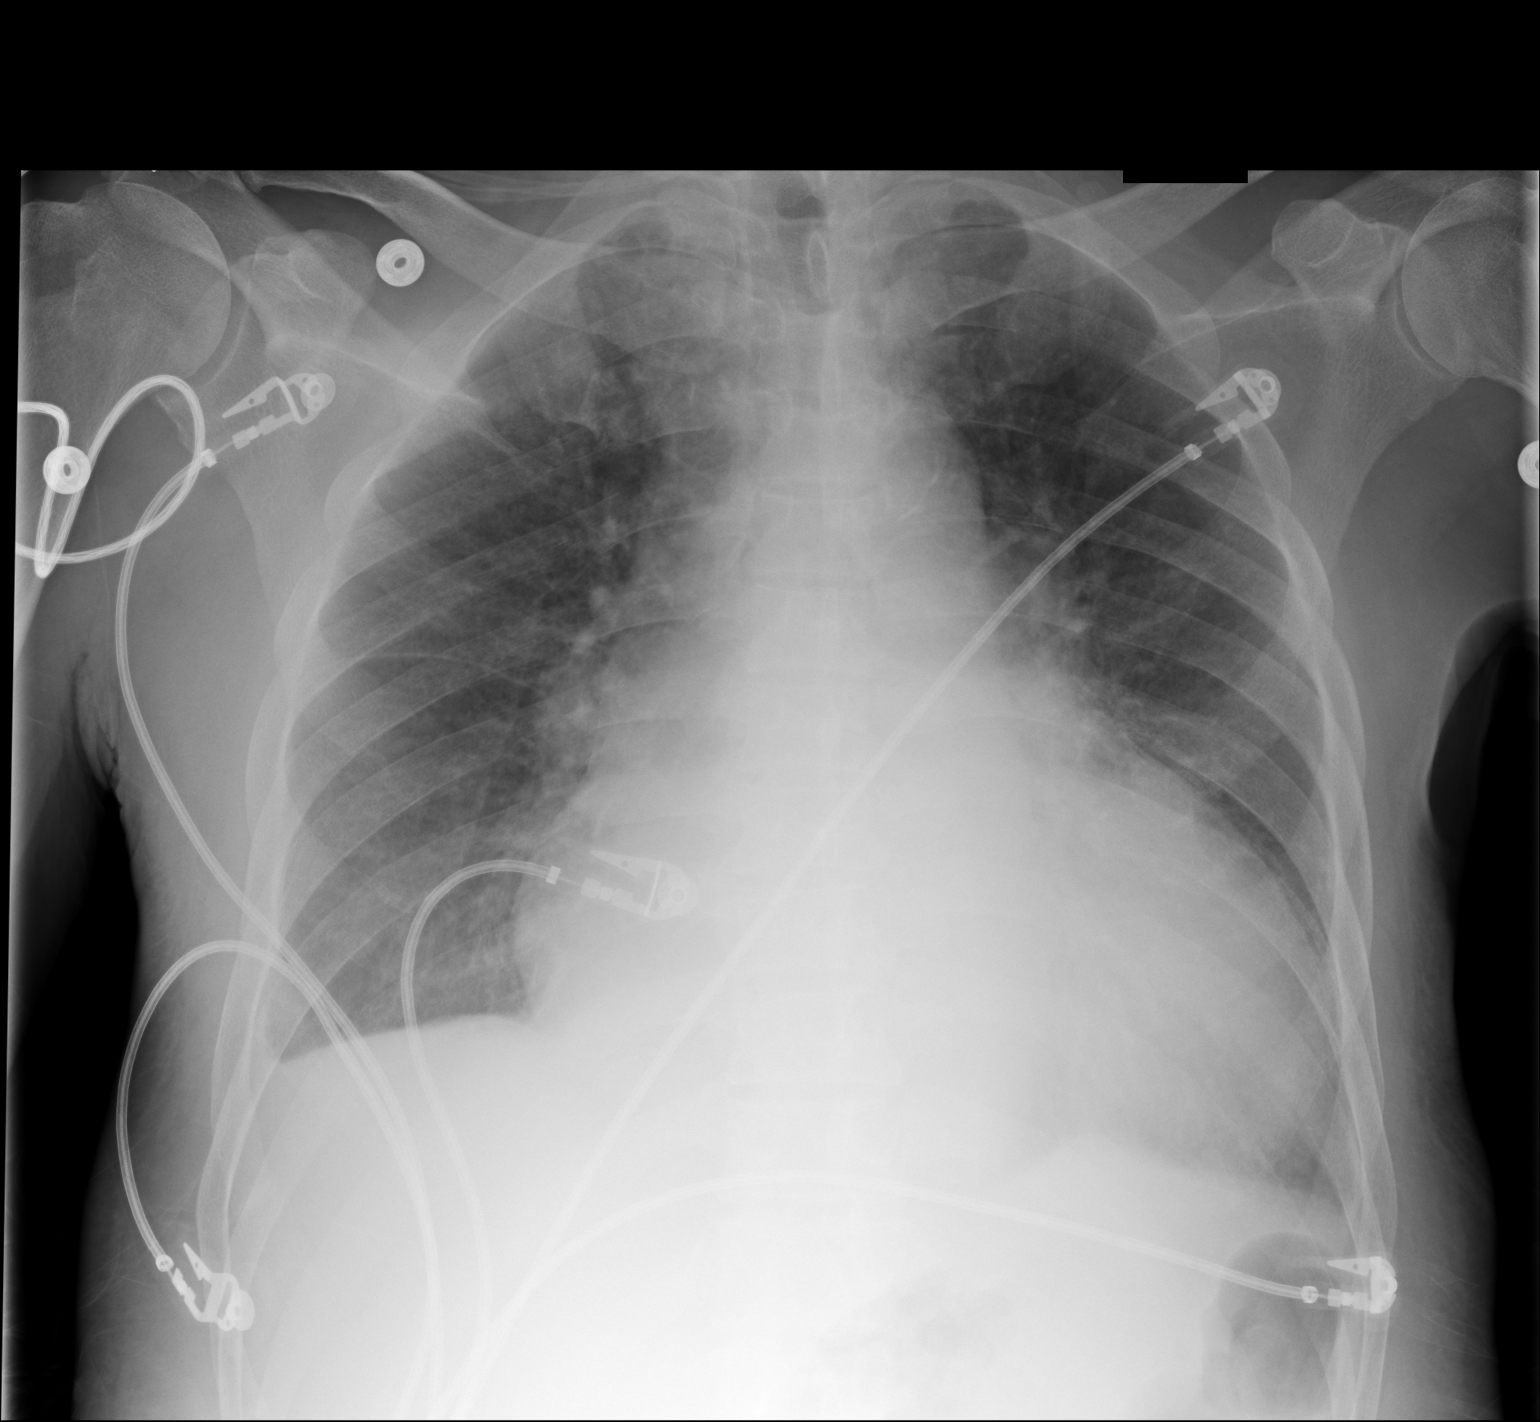

[1 of 1 positions shown; findings below may reference images not displayed]

FINDINGS: Cardiomegaly is present. No pulmonary venous congestion noted. Mild
interstitial prominence with small right pleural effusion present.
Findings consistent mild congestive heart failure. These findings
have improved from prior study .
IMPRESSION: 1. Improving congestive heart failure and pulmonary interstitial
edema.
2. Tiny right pleural effusion remains unchanged.

## 2015-10-26 IMAGING — CR DG CHEST 1V PORT
1 series · 1 of 1 positions shown · non-contrast
Comparison: 06/20/2013

CLINICAL DATA: Hypotension

EXAM:
PORTABLE CHEST - 1 VIEW

[ap]
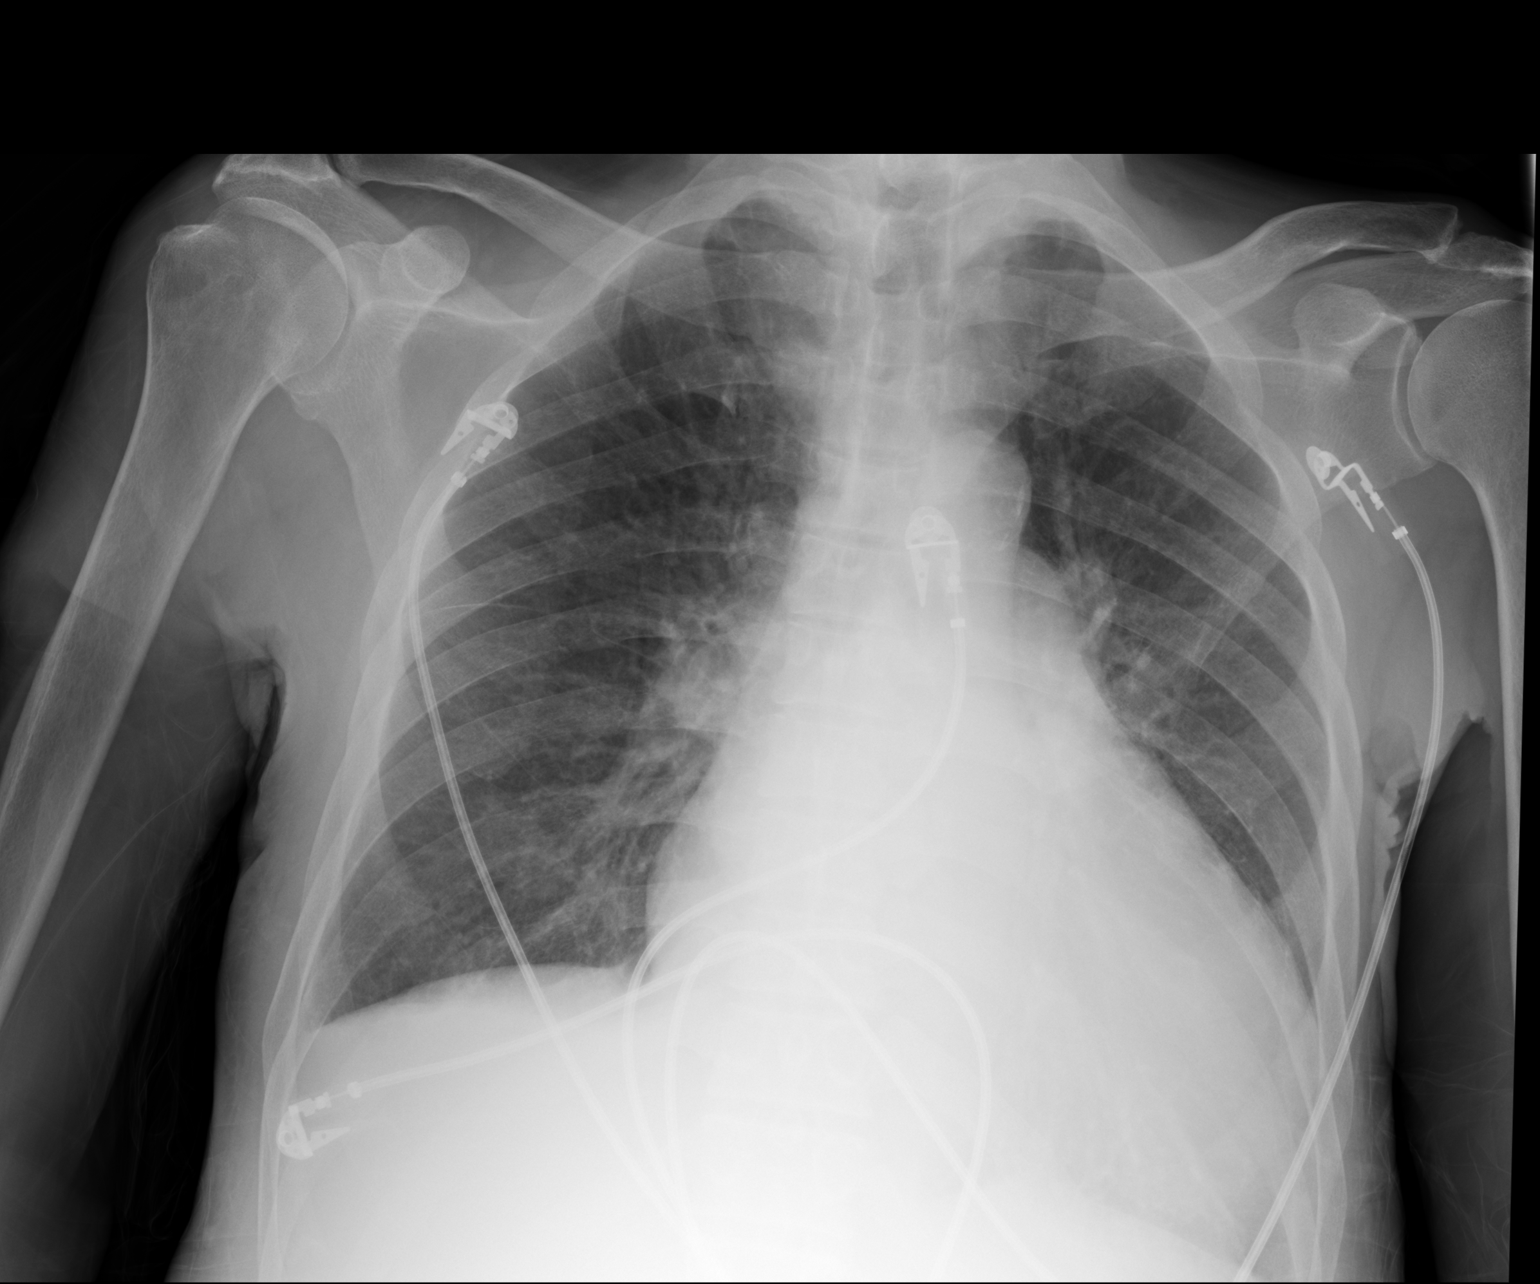

[1 of 1 positions shown; findings below may reference images not displayed]

FINDINGS: Unchanged cardiopericardial enlargement. Chronic interstitial
prominence. No consolidation, edema, effusion, or pneumothorax.
IMPRESSION: Cardiomegaly without failure.

## 2015-10-29 IMAGING — CR DG CHEST 1V PORT
1 series · 1 of 1 positions shown · non-contrast
Comparison: Prior chest x-ray 07/16/2013

CLINICAL DATA: Line placement

EXAM:
PORTABLE CHEST - 1 VIEW

[ap]
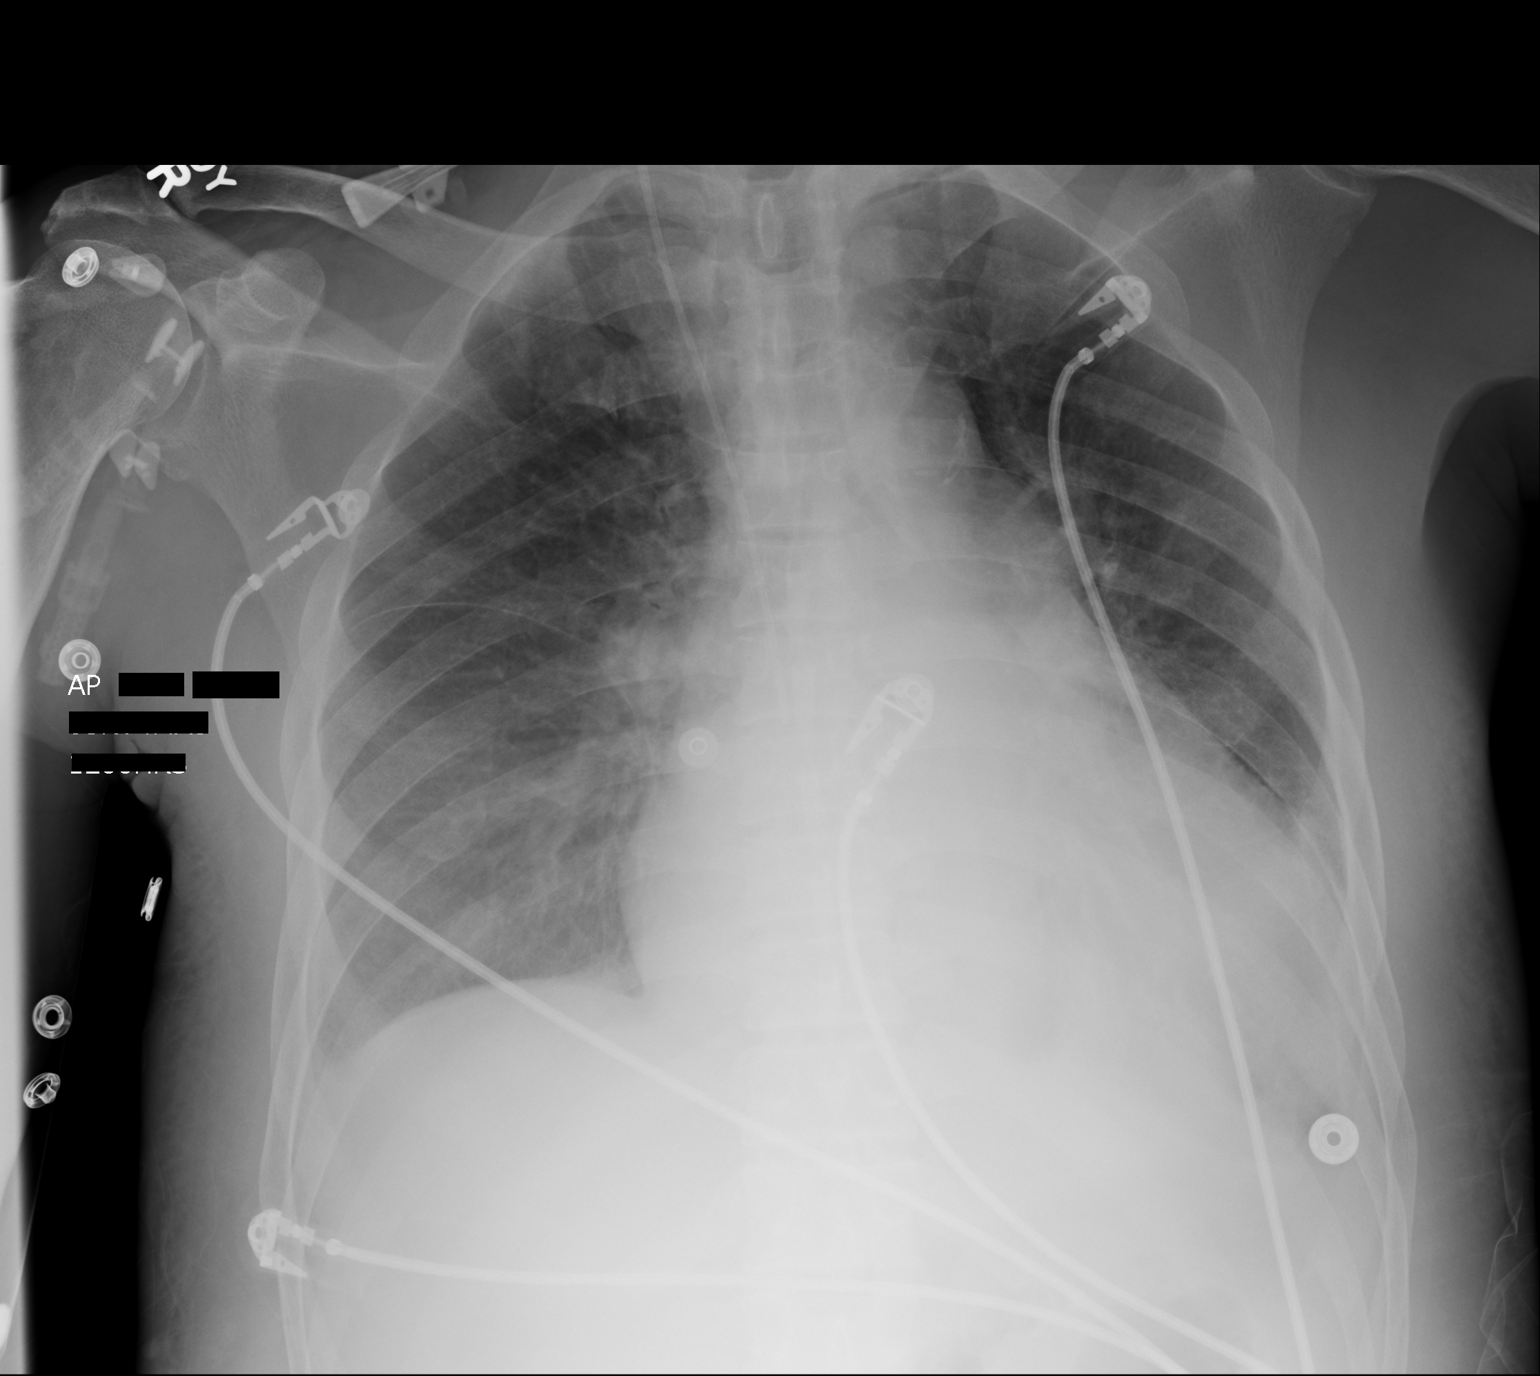

[1 of 1 positions shown; findings below may reference images not displayed]

FINDINGS: Interval placement of a right IJ central venous catheter. Catheter
tip projects over the superior cavoatrial junction. Stable
cardiomegaly. Bilateral layering pleural effusions. Left greater
than right basilar opacities. No pneumothorax or other acute
complication. Otherwise, no significant interval change in the
appearance of the chest. No acute osseous abnormality.
IMPRESSION: 1. New right IJ approach central venous catheter without evidence of
complication. Catheter tip projects over the superior cavoatrial
junction.
2. Bilateral layering pleural effusions.
3. Left greater than right bibasilar opacities likely reflect a
combination pleural fluid with atelectasis. Superimposed infiltrate
is difficult to exclude in the left base.

## 2015-11-03 IMAGING — DX DG CHEST 1V PORT
1 series · 1 of 1 positions shown · non-contrast
Comparison: DG CHEST 1V PORT dated 07/22/2013; DG CHEST 2 VIEW dated
06/24/2006; DG CHEST 2 VIEW dated 06/20/2006

CLINICAL DATA: Evaluate pneumonia, pleural effusion

EXAM:
PORTABLE CHEST - 1 VIEW

[portable]
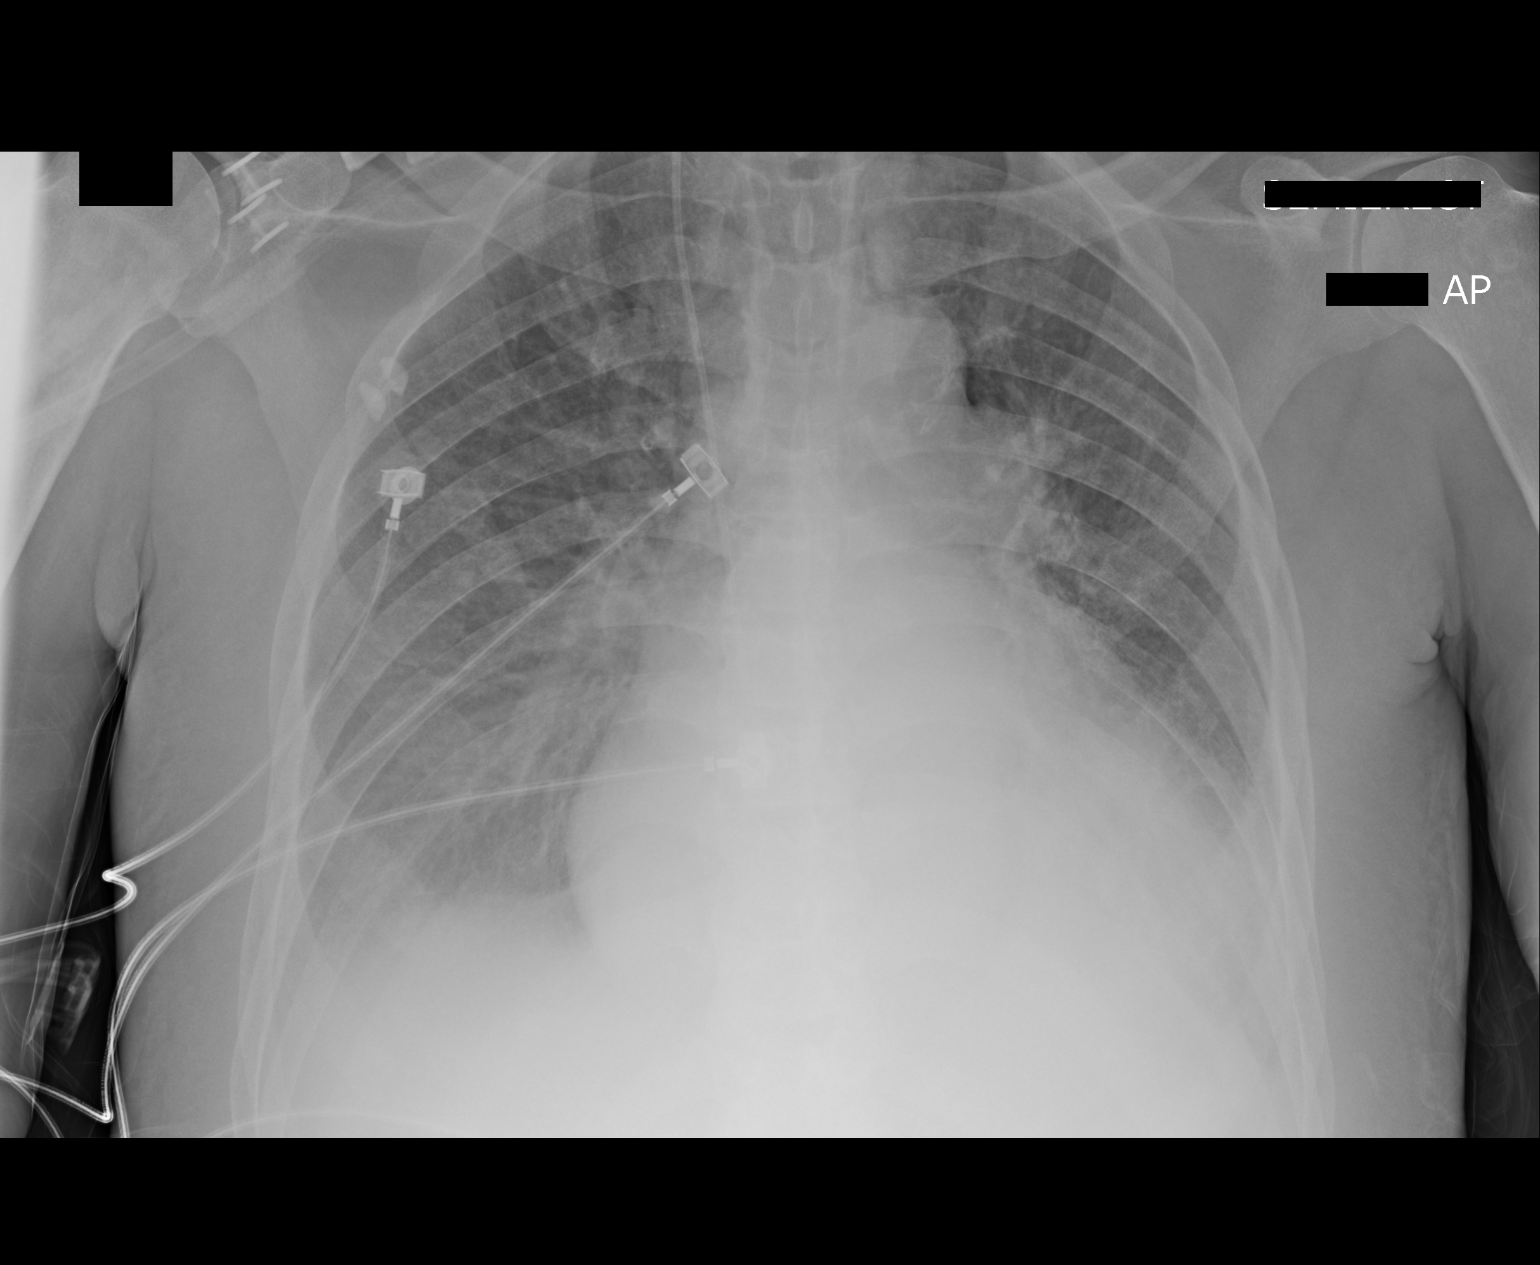

[1 of 1 positions shown; findings below may reference images not displayed]

FINDINGS: Grossly unchanged enlarged cardiac silhouette and mediastinal
contours. Stable position of support apparatus. The pulmonary
vasculature appears less distinct than present examination with
cephalization of flow. Interval worsening of small bilateral
effusions and associated bibasilar heterogeneous opacities, left
greater than right. No pneumothorax. Unchanged bones.
IMPRESSION: Findings compatible with worsening pulmonary edema with interval
increase in small bilateral effusions and associated bibasilar
opacities, left greater than right, atelectasis versus infiltrate.

## 2015-11-10 IMAGING — US IR US GUIDE VASC ACCESS RIGHT
1 series · 2 of 2 positions shown · non-contrast
Comparison: none

CLINICAL DATA: Renal failure

[Series 1: ir us guide vasc access right · 2 of 2 slices shown]
[im 1/2]
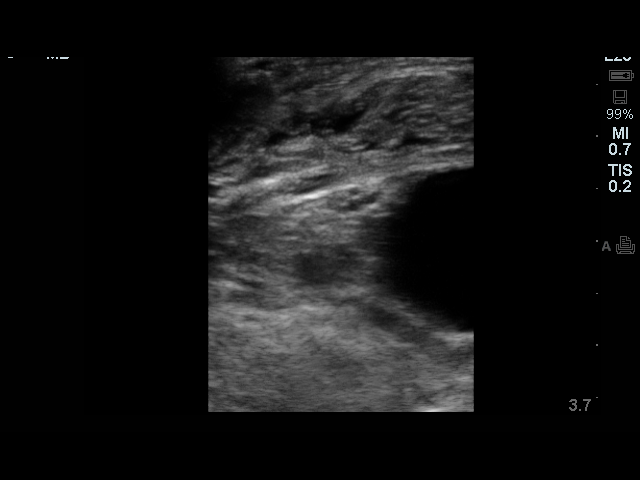
[im 2/2]
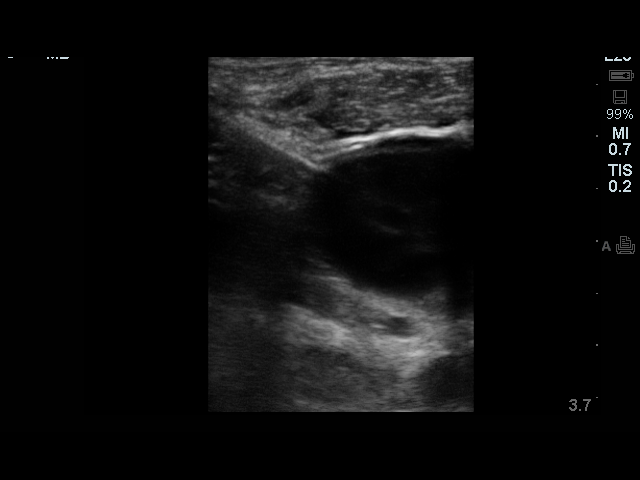

[2 of 2 positions shown; findings below may reference images not displayed]

EXAM:
IR RIGHT FLOURO GUIDE CV LINE; IR ULTRASOUND GUIDANCE VASC ACCESS
RIGHT

FLUOROSCOPY TIME:  18 seconds.

MEDICATIONS AND MEDICAL HISTORY:
None

ANESTHESIA/SEDATION:
None

CONTRAST:  None

PROCEDURE:
The procedure, risks, benefits, and alternatives were explained to
the patient. Questions regarding the procedure were encouraged and
answered. The patient understands and consents to the procedure.

The right neck was prepped with Betadine in a sterile fashion, and a
sterile drape was applied covering the operative field. A sterile
gown and sterile gloves were used for the procedure.

Under sonographic guidance, an 18 gauge needle was advanced into the
right internal jugular vein and removed over a 3 J. A dilator
followed by the temporary dialysis catheter was advanced over the
wire to the cavoatrial junction. It was flushed and sewn in place.
FINDINGS: Image demonstrates placement of a temporary dialysis catheter with
its tip at the cavoatrial junction.

COMPLICATIONS:
None
IMPRESSION: Successful right internal jugular vein tunneled dialysis catheter
placement.
